# Patient Record
Sex: Female | Born: 1955 | Race: Black or African American | Hispanic: No | Smoking: Current every day smoker
Health system: Southern US, Community
[De-identification: ages and names within clinical notes are randomized; demographics above are authoritative.]

## PROBLEM LIST (undated history)

## (undated) DIAGNOSIS — I10 Essential (primary) hypertension: Secondary | ICD-10-CM

## (undated) DIAGNOSIS — M51369 Other intervertebral disc degeneration, lumbar region without mention of lumbar back pain or lower extremity pain: Secondary | ICD-10-CM

## (undated) DIAGNOSIS — F410 Panic disorder [episodic paroxysmal anxiety] without agoraphobia: Secondary | ICD-10-CM

## (undated) DIAGNOSIS — G629 Polyneuropathy, unspecified: Secondary | ICD-10-CM

## (undated) DIAGNOSIS — M5136 Other intervertebral disc degeneration, lumbar region: Secondary | ICD-10-CM

## (undated) DIAGNOSIS — J449 Chronic obstructive pulmonary disease, unspecified: Secondary | ICD-10-CM

## (undated) HISTORY — PX: BACK SURGERY: SHX140

## (undated) HISTORY — PX: CHOLECYSTECTOMY: SHX55

---

## 2015-09-25 DIAGNOSIS — M542 Cervicalgia: Secondary | ICD-10-CM | POA: Insufficient documentation

## 2016-10-19 ENCOUNTER — Emergency Department (HOSPITAL_COMMUNITY)
Admission: EM | Admit: 2016-10-19 | Discharge: 2016-10-19 | Disposition: A | Discharge status: Home / Self Care | Payer: Medicare Other | Attending: Emergency Medicine | Admitting: Emergency Medicine

## 2016-10-19 ENCOUNTER — Encounter (HOSPITAL_COMMUNITY): Payer: Self-pay | Admitting: Emergency Medicine

## 2016-10-19 ENCOUNTER — Emergency Department (HOSPITAL_COMMUNITY): Payer: Medicare Other

## 2016-10-19 DIAGNOSIS — R1011 Right upper quadrant pain: Secondary | ICD-10-CM

## 2016-10-19 DIAGNOSIS — Z79899 Other long term (current) drug therapy: Secondary | ICD-10-CM | POA: Insufficient documentation

## 2016-10-19 DIAGNOSIS — J449 Chronic obstructive pulmonary disease, unspecified: Secondary | ICD-10-CM | POA: Diagnosis not present

## 2016-10-19 DIAGNOSIS — F17210 Nicotine dependence, cigarettes, uncomplicated: Secondary | ICD-10-CM | POA: Diagnosis not present

## 2016-10-19 DIAGNOSIS — I10 Essential (primary) hypertension: Secondary | ICD-10-CM | POA: Diagnosis not present

## 2016-10-19 DIAGNOSIS — Z760 Encounter for issue of repeat prescription: Secondary | ICD-10-CM | POA: Insufficient documentation

## 2016-10-19 DIAGNOSIS — R310 Gross hematuria: Secondary | ICD-10-CM | POA: Insufficient documentation

## 2016-10-19 HISTORY — DX: Polyneuropathy, unspecified: G62.9

## 2016-10-19 HISTORY — DX: Other intervertebral disc degeneration, lumbar region: M51.36

## 2016-10-19 HISTORY — DX: Panic disorder (episodic paroxysmal anxiety): F41.0

## 2016-10-19 HISTORY — DX: Essential (primary) hypertension: I10

## 2016-10-19 HISTORY — DX: Other intervertebral disc degeneration, lumbar region without mention of lumbar back pain or lower extremity pain: M51.369

## 2016-10-19 HISTORY — DX: Chronic obstructive pulmonary disease, unspecified: J44.9

## 2016-10-19 LAB — COMPREHENSIVE METABOLIC PANEL
ALT: 12 U/L — AB (ref 14–54)
AST: 15 U/L (ref 15–41)
Albumin: 4.3 g/dL (ref 3.5–5.0)
Alkaline Phosphatase: 95 U/L (ref 38–126)
Anion gap: 8 (ref 5–15)
BUN: 10 mg/dL (ref 6–20)
CO2: 34 mmol/L — ABNORMAL HIGH (ref 22–32)
CREATININE: 0.79 mg/dL (ref 0.44–1.00)
Calcium: 9.5 mg/dL (ref 8.9–10.3)
Chloride: 100 mmol/L — ABNORMAL LOW (ref 101–111)
GFR calc non Af Amer: >60 mL/min (ref >60)
Glucose, Bld: 92 mg/dL (ref 65–99)
Potassium: 3.2 mmol/L — ABNORMAL LOW (ref 3.5–5.1)
Sodium: 142 mmol/L (ref 135–145)
TOTAL PROTEIN: 7.8 g/dL (ref 6.5–8.1)
Total Bilirubin: 0.9 mg/dL (ref 0.3–1.2)

## 2016-10-19 LAB — URINALYSIS, ROUTINE W REFLEX MICROSCOPIC
Bacteria, UA: NONE SEEN
Bilirubin Urine: NEGATIVE
GLUCOSE, UA: NEGATIVE mg/dL
KETONES UR: NEGATIVE mg/dL
Nitrite: NEGATIVE
PH: 8 (ref 5.0–8.0)
Protein, ur: 100 mg/dL — AB
Specific Gravity, Urine: 1.021 (ref 1.005–1.030)

## 2016-10-19 LAB — CBC
HCT: 43.2 % (ref 36.0–46.0)
Hemoglobin: 14.6 g/dL (ref 12.0–15.0)
MCH: 28.3 pg (ref 26.0–34.0)
MCHC: 33.8 g/dL (ref 30.0–36.0)
MCV: 83.7 fL (ref 78.0–100.0)
PLATELETS: 197 10*3/uL (ref 150–400)
RBC: 5.16 MIL/uL — AB (ref 3.87–5.11)
RDW: 14.1 % (ref 11.5–15.5)
WBC: 5.1 10*3/uL (ref 4.0–10.5)

## 2016-10-19 LAB — LIPASE, BLOOD: LIPASE: 15 U/L (ref 11–51)

## 2016-10-19 MED ORDER — ACETAMINOPHEN 325 MG PO TABS
650.0000 mg | ORAL_TABLET | Freq: Once | ORAL | Status: AC
  Administered 2016-10-19: 650 mg via ORAL
  Filled 2016-10-19: qty 2

## 2016-10-19 MED ORDER — DULOXETINE HCL 30 MG PO CPEP: 30.0000 mg | ORAL_CAPSULE | Freq: Every day | ORAL | 0 refills | Status: AC

## 2016-10-19 MED ORDER — BACLOFEN 10 MG PO TABS: 10.0000 mg | ORAL_TABLET | Freq: Three times a day (TID) | ORAL | 1 refills | Status: AC

## 2016-10-19 MED ORDER — AMLODIPINE BESYLATE 5 MG PO TABS: 5.0000 mg | ORAL_TABLET | Freq: Every day | ORAL | 0 refills | Status: DC

## 2016-10-19 NOTE — Discharge Instructions (Signed)
You have been evaluated for your abdominal.  No acute changes noted on today's exam.  You have evidence of blood in your urine.  Please follow up with urologist for further management.

## 2016-10-19 NOTE — ED Triage Notes (Addendum)
Patient states that she been having RUQ pain thought was gas but not relieved with any gas x for 2 weeks.  Patient states that is same pains from when she had her gallbladder removed.  Denies n/v. Patient states that she drank baking soda then had diarrhea after.  patient moved here from [place] in Jan and ran out of all her medications on Jan13.  Patient unsure if this could be related or not. Patient tried getting set up with PCP here but cant get in until April

## 2016-10-19 NOTE — ED Provider Notes (Signed)
[facility]-EMERGENCY DEPT Provider Note   CSN: [id number] Arrival date & time: 10/19/16  0810     History   Chief Complaint Chief Complaint  Patient presents with  . RUQ pain    HPI Nancy Marsh is a 61 y.o. female.  HPI   61 year old female with prior surgical history including cholecystectomy presenting today complaining of abdominal pain. Patient states she had her gallbladder removed in 2012. For the past month she has had intermittent pain to the right upper quadrant in the same location and the same sensation when she had her gallbladder removed. Pain is described as a gas-like pressure that comes and goes, usually lasting for several minutes. She tries using Gas-X, baking sodas, and other home remedy with minimal improvement. She denies any associated fever, chills, chest pain, shortness breath, productive cough, dysuria, hematuria, back pain, or rash. She recently moved from [place] to [place] high in January and states that she is out of her medications including her pain medication. States that she was worked up for potential MS and she also has neuropathy and degenerative disc disease and currently taking Percocet. She is actively trying to find a primary care provider but will not have one assigned until April.  Past Medical History:  Diagnosis Date  . COPD (chronic obstructive pulmonary disease) (HCC)   . Degenerative disc disease, lumbar   . Hypertension   . Neuropathy (HCC)   . Panic attack     There are no active problems to display for this patient.   Past Surgical History:  Procedure Laterality Date  . BACK SURGERY    . CHOLECYSTECTOMY      OB History    No data available       Home Medications    Prior to Admission medications   Medication Sig Start Date End Date Taking? Authorizing Provider  albuterol (PROVENTIL HFA;VENTOLIN HFA) 108 (90 Base) MCG/ACT inhaler Inhale 1 puff into the lungs every 6 (six) hours as needed for wheezing or shortness of  breath.   Yes Historical Provider, MD  amLODipine (NORVASC) 5 MG tablet Take 5 mg by mouth daily.   Yes Historical Provider, MD  baclofen (LIORESAL) 10 MG tablet Take 10 mg by mouth 3 (three) times daily.   Yes Historical Provider, MD  DULoxetine (CYMBALTA) 30 MG capsule Take 30 mg by mouth daily.   Yes Historical Provider, MD  Fluticasone-Salmeterol (ADVAIR) 250-50 MCG/DOSE AEPB Inhale 1 puff into the lungs 2 (two) times daily.   Yes Historical Provider, MD  oxyCODONE-acetaminophen (PERCOCET/ROXICET) 5-325 MG tablet Take 1 tablet by mouth every 6 (six) hours as needed for severe pain.   Yes Historical Provider, MD    Family History No family history on file.  Social History Social History  Substance Use Topics  . Smoking status: Current Every Day Smoker    Types: Cigarettes  . Smokeless tobacco: Never Used  . Alcohol use No     Allergies   Ibuprofen   Review of Systems Review of Systems  All other systems reviewed and are negative.    Physical Exam Updated Vital Signs BP 158/98   Pulse 71   Temp 98.7 F (37.1 C) (Oral)   Resp 15   SpO2 100%   Physical Exam  Constitutional: She appears well-developed and well-nourished. No distress.  HENT:  Head: Atraumatic.  Eyes: Conjunctivae are normal.  Neck: Neck supple.  Cardiovascular: Normal rate and regular rhythm.   Pulmonary/Chest: Effort normal and breath sounds normal.  Abdominal: Soft.  She exhibits no distension. There is tenderness (Mild diffuse upper abdominal tenderness without guarding or rebound tenderness. Negative Murphy sign, no pain at McBurney's point.).  Neurological: She is alert.  Skin: No rash noted.  Psychiatric: She has a normal mood and affect.  Nursing note and vitals reviewed.    ED Treatments / Results  Labs (all labs ordered are listed, but only abnormal results are displayed) Labs Reviewed  COMPREHENSIVE METABOLIC PANEL - Abnormal; Notable for the following:       Result Value    Potassium 3.2 (*)    Chloride 100 (*)    CO2 34 (*)    ALT 12 (*)    All other components within normal limits  CBC - Abnormal; Notable for the following:    RBC 5.16 (*)    All other components within normal limits  URINALYSIS, ROUTINE W REFLEX MICROSCOPIC - Abnormal; Notable for the following:    APPearance HAZY (*)    Hgb urine dipstick SMALL (*)    Protein, ur 100 (*)    Leukocytes, UA TRACE (*)    Squamous Epithelial / LPF 0-5 (*)    All other components within normal limits  LIPASE, BLOOD    EKG  EKG Interpretation None       Radiology Ct Renal Stone Study  Result Date: 10/19/2016 CLINICAL DATA:  Right upper quadrant pain not relieved for 2 weeks. Prior gallbladder removal. No nausea or vomiting. EXAM: CT ABDOMEN AND PELVIS WITHOUT CONTRAST TECHNIQUE: Multidetector CT imaging of the abdomen and pelvis was performed following the standard protocol without IV contrast. COMPARISON:  None. FINDINGS: Lower chest: No acute abnormality. Hepatobiliary: Multiple hypodense, fluid attenuating hepatic masses with the largest measuring 10 mm in the right hepatic lobe likely reflecting cysts. Prior cholecystectomy. No intrahepatic or extrahepatic biliary ductal dilatation. Pancreas: Unremarkable. No pancreatic ductal dilatation or surrounding inflammatory changes. Spleen: Normal in size without focal abnormality. Adrenals/Urinary Tract: Adrenal glands are unremarkable. Kidneys are normal, without renal calculi, focal lesion, or hydronephrosis. Bladder is unremarkable. Stomach/Bowel: Stomach is within normal limits. Appendix appears normal. No evidence of bowel wall thickening, distention, or inflammatory changes. No pneumatosis, pneumoperitoneum or portal venous gas. Vascular/Lymphatic: Normal caliber abdominal aorta with atherosclerosis. No lymphadenopathy. Reproductive: No adnexal mass. Ill-defined partially calcified uterine mass likely reflecting a uterine fibroid. Other: No abdominal wall  hernia or abnormality. No abdominopelvic ascites. Musculoskeletal: No acute osseous abnormality. No lytic or sclerotic osseous lesion. Posterior lumbar interbody fusion from L4 through S1. No significant osseous bridging across the L5-S1 disc space as can be seen with pseudoarthrosis. Solid osseous bridging across the L4-5 vertebral body and posterior elements. IMPRESSION: 1. No acute abdominal or pelvic pathology. 2. Prior cholecystectomy. 3.  Aortic Atherosclerosis (ICD10-170.0) Electronically Signed   By: Elige Ko   On: 10/19/2016 11:49    Procedures Procedures (including critical care time)  Medications Ordered in ED Medications - No data to display   Initial Impression / Assessment and Plan / ED Course  I have reviewed the triage vital signs and the nursing notes.  Pertinent labs & imaging results that were available during my care of the patient were reviewed by me and considered in my medical decision making (see chart for details).     BP 158/98   Pulse 71   Temp 98.7 F (37.1 C) (Oral)   Resp 15   SpO2 100%    Final Clinical Impressions(s) / ED Diagnoses   Final diagnoses:  RUQ pain  Medication  refill  Gross hematuria    New Prescriptions New Prescriptions   No medications on file   10:02 AM Patient here with recurrent upper abdominal pain. She has had her gallbladder removed. On exam she has a fairly benign abdomen. Patient also reports that she is out of her home medication due to moving here from [place]. Along the lists of the medication is Percocet. She takes it for chronic back pain. Plan to prescribe blood pressure medication and anti-depression medication however I do not think it is appropriate to prescribe narcotic pain medication for chronic pain.  12:26 PM Patient urine shows RBC too numerous to count but no obvious signs of a urinary tract infection. She has a normal lipase. Her electrolytes panel are reassuring patient has no leukocytosis. An  abdominal and pelvis CT scan reveals no acute pathology. I will refill patient's regular medication and encouraged her to follow-up with PCP for further care. Return precaution discussed. Since patient has blood in the urine without signs of kidney stones or UTI I encouraged patient to follow-up with urology for outpatient care.   Fayrene Helper, PA-C 10/19/16 1235    Derwood Kaplan, MD 10/19/16 [phone]

## 2016-12-08 DIAGNOSIS — J3089 Other allergic rhinitis: Secondary | ICD-10-CM | POA: Insufficient documentation

## 2016-12-08 DIAGNOSIS — M48061 Spinal stenosis, lumbar region without neurogenic claudication: Secondary | ICD-10-CM | POA: Insufficient documentation

## 2016-12-08 DIAGNOSIS — M5136 Other intervertebral disc degeneration, lumbar region: Secondary | ICD-10-CM | POA: Insufficient documentation

## 2016-12-08 DIAGNOSIS — F17219 Nicotine dependence, cigarettes, with unspecified nicotine-induced disorders: Secondary | ICD-10-CM | POA: Insufficient documentation

## 2017-12-21 DIAGNOSIS — E559 Vitamin D deficiency, unspecified: Secondary | ICD-10-CM | POA: Insufficient documentation

## 2018-01-31 IMAGING — CT CT RENAL STONE PROTOCOL
2 of 3 series · 16 of 46 positions shown, 18 images · non-contrast
Comparison: None.

CLINICAL DATA: Right upper quadrant pain not relieved for 2 weeks.
Prior gallbladder removal. No nausea or vomiting.

EXAM:
CT ABDOMEN AND PELVIS WITHOUT CONTRAST
TECHNIQUE: Multidetector CT imaging of the abdomen and pelvis was performed
following the standard protocol without IV contrast.

[Series 4: lung · axial · 0.74mm/px · z∈[-181,-117]mm · 13 of 38 slices shown, 15 images]
[im 3/38  soft-tissue]
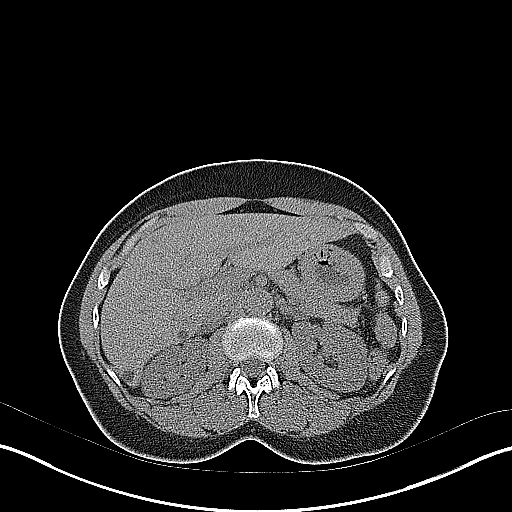
[im 3/38  bone]
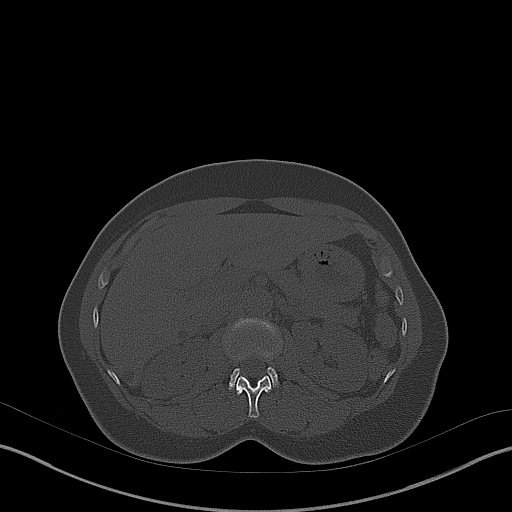
[im 5/38  soft-tissue]
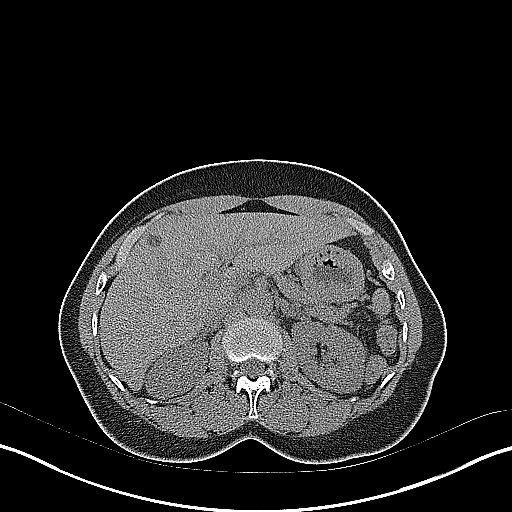
[im 8/38  soft-tissue]
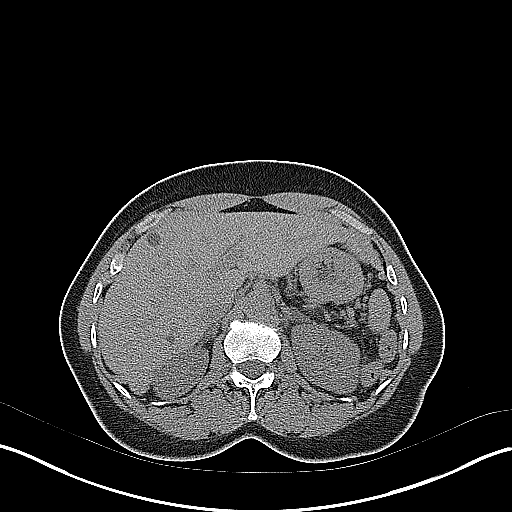
[im 11/38  soft-tissue]
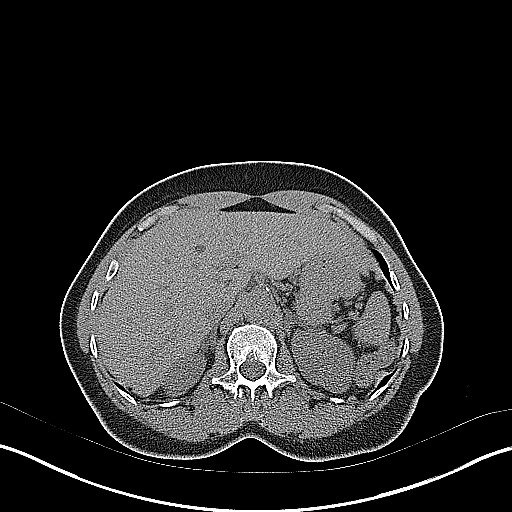
[im 14/38  soft-tissue]
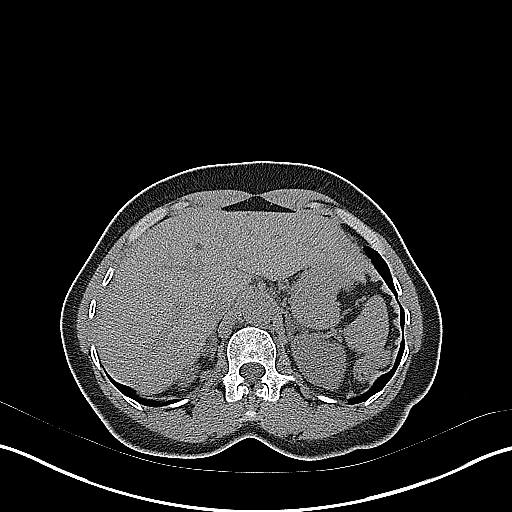
[im 16/38  soft-tissue]
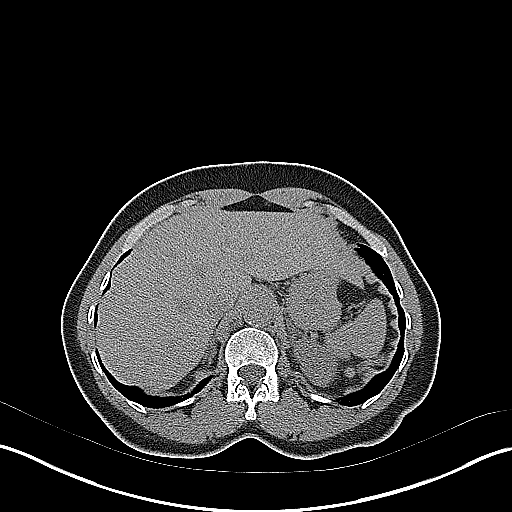
[im 20/38  soft-tissue]
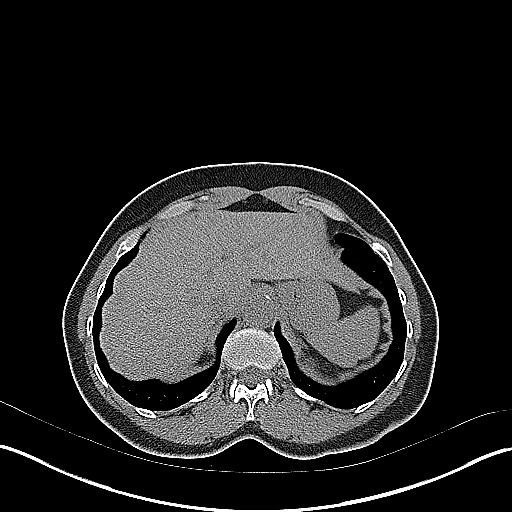
[im 22/38  soft-tissue]
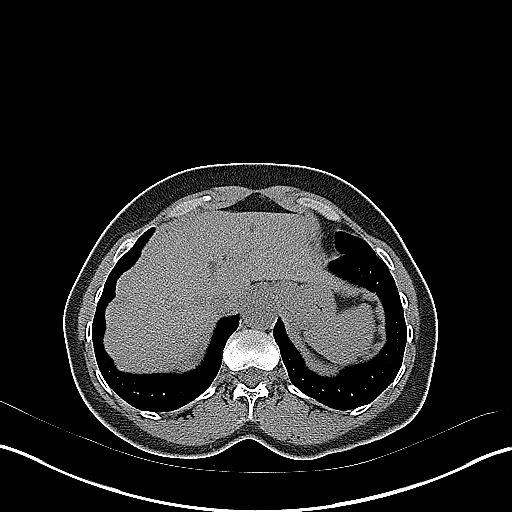
[im 24/38  soft-tissue]
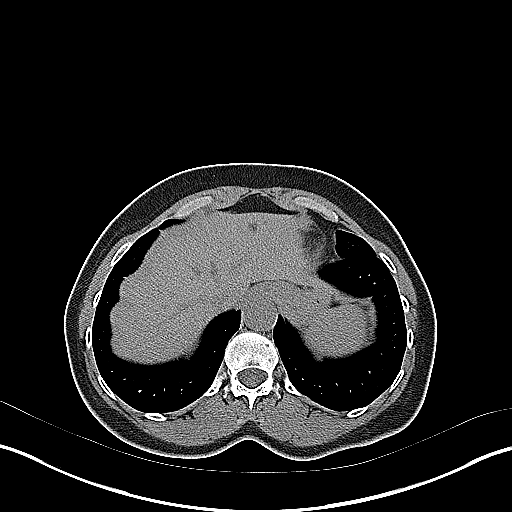
[im 24/38  bone]
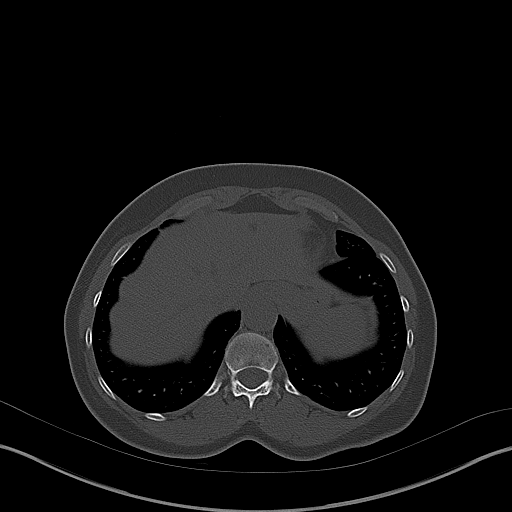
[im 27/38  soft-tissue]
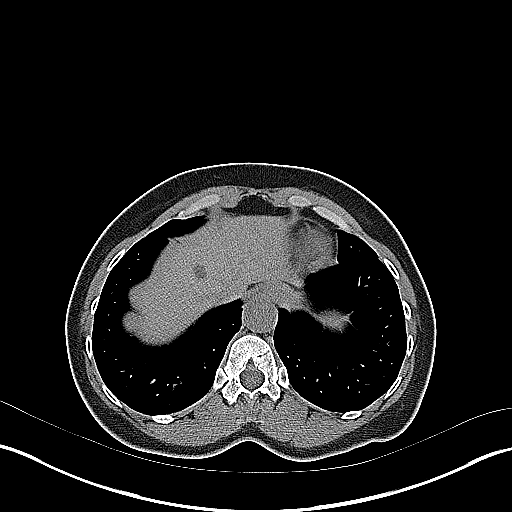
[im 30/38  soft-tissue]
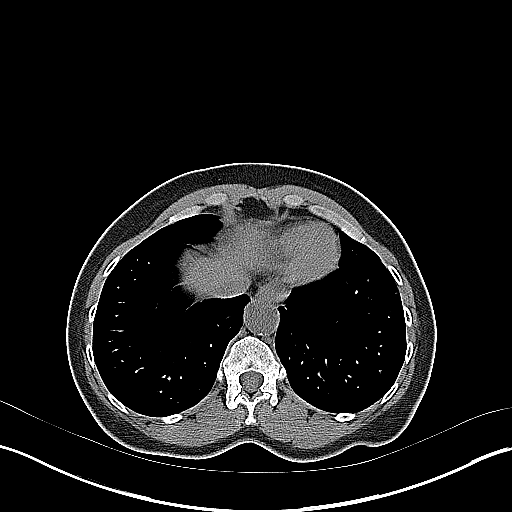
[im 33/38  soft-tissue]
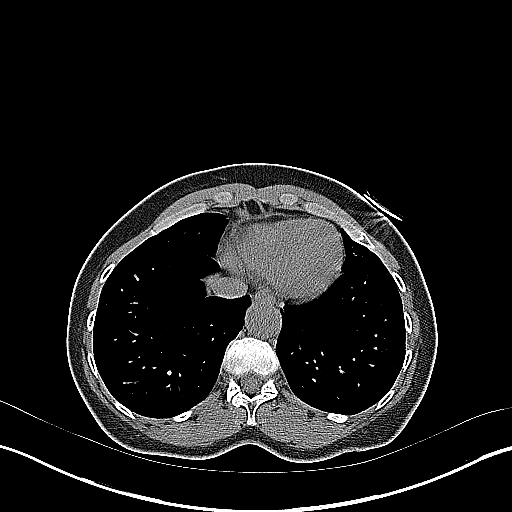
[im 35/38  soft-tissue]
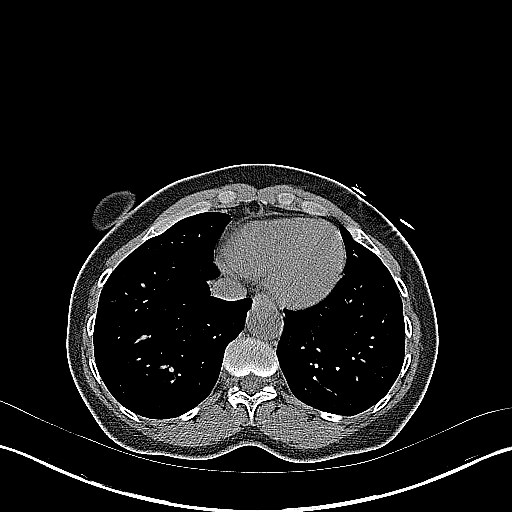

[Series 5: coronal · coronal · 0.73mm/px · 3 of 122 slices shown]
[im 41/122  soft-tissue]
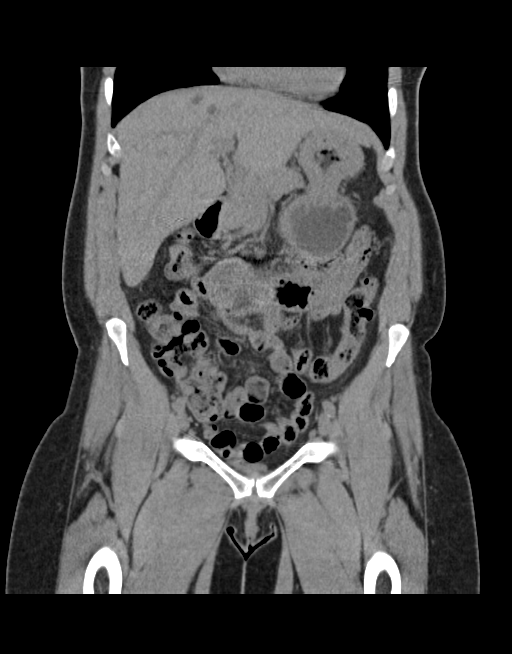
[im 54/122  soft-tissue]
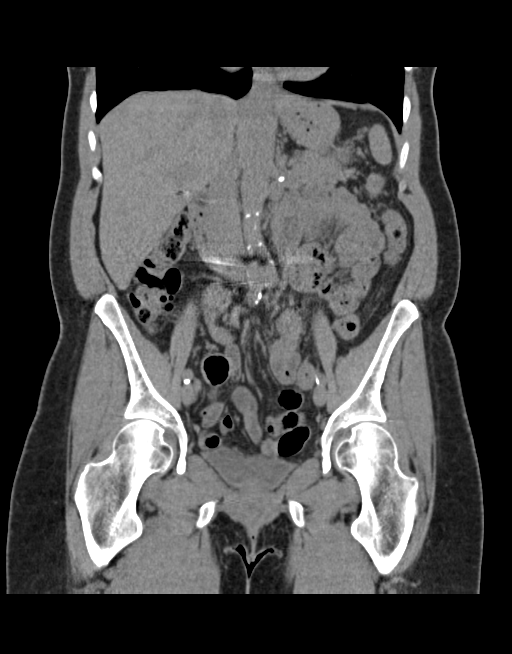
[im 68/122  soft-tissue]
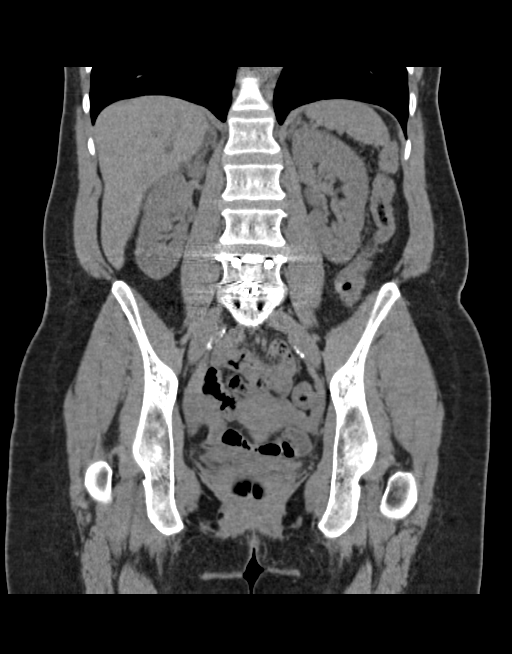

[16 of 46 positions shown; findings below may reference images not displayed]

FINDINGS: Lower chest: No acute abnormality.

Hepatobiliary: Multiple hypodense, fluid attenuating hepatic masses
with the largest measuring 10 mm in the right hepatic lobe likely
reflecting cysts. Prior cholecystectomy. No intrahepatic or
extrahepatic biliary ductal dilatation.

Pancreas: Unremarkable. No pancreatic ductal dilatation or
surrounding inflammatory changes.

Spleen: Normal in size without focal abnormality.

Adrenals/Urinary Tract: Adrenal glands are unremarkable. Kidneys are
normal, without renal calculi, focal lesion, or hydronephrosis.
Bladder is unremarkable.

Stomach/Bowel: Stomach is within normal limits. Appendix appears
normal. No evidence of bowel wall thickening, distention, or
inflammatory changes. No pneumatosis, pneumoperitoneum or portal
venous gas.

Vascular/Lymphatic: Normal caliber abdominal aorta with
atherosclerosis. No lymphadenopathy.

Reproductive: No adnexal mass. Ill-defined partially calcified
uterine mass likely reflecting a uterine fibroid.

Other: No abdominal wall hernia or abnormality. No abdominopelvic
ascites.

Musculoskeletal: No acute osseous abnormality. No lytic or sclerotic
osseous lesion. Posterior lumbar interbody fusion from L4 through
S1. No significant osseous bridging across the L5-S1 disc space as
can be seen with pseudoarthrosis. Solid osseous bridging across the
L4-5 vertebral body and posterior elements.
IMPRESSION: 1. No acute abdominal or pelvic pathology.
2. Prior cholecystectomy.
3.  Aortic Atherosclerosis ([id number])

## 2018-12-28 DIAGNOSIS — F5101 Primary insomnia: Secondary | ICD-10-CM | POA: Insufficient documentation

## 2020-01-02 ENCOUNTER — Encounter: Payer: Self-pay | Admitting: Pulmonary Disease

## 2020-01-02 ENCOUNTER — Other Ambulatory Visit: Payer: Self-pay

## 2020-01-02 ENCOUNTER — Ambulatory Visit: Payer: Medicare Other | Admitting: Pulmonary Disease

## 2020-01-02 VITALS — BP 132/84 | HR 106 | Ht 64.0 in | Wt 123.6 lb

## 2020-01-02 DIAGNOSIS — J449 Chronic obstructive pulmonary disease, unspecified: Secondary | ICD-10-CM

## 2020-01-02 MED ORDER — TRELEGY ELLIPTA 200-62.5-25 MCG/INH IN AEPB: 1.0000 | INHALATION_SPRAY | Freq: Every day | RESPIRATORY_TRACT | 0 refills | Status: DC

## 2020-01-02 MED ORDER — TRELEGY ELLIPTA 200-62.5-25 MCG/INH IN AEPB: 1.0000 | INHALATION_SPRAY | Freq: Every day | RESPIRATORY_TRACT | 2 refills | Status: DC

## 2020-01-02 NOTE — Patient Instructions (Addendum)
We will get some labs today including CBC with differential, IgE, alpha-1 antitrypsin levels and phenotype, hypersensitivity pneumonitis panel. Schedule pulmonary function test and high-res CT of the chest to evaluate hypersensitivity pneumonitis. Stop Advair and start Trelegy inhaler Follow-up in 1 to 2 months.

## 2020-01-02 NOTE — Addendum Note (Signed)
Addended by: Jacquiline Doe on: 01/02/2020 05:11 PM   Modules accepted: Orders

## 2020-01-02 NOTE — Progress Notes (Signed)
Nancy Marsh    [id number]    02-06-1956  Primary Care Physician:Beal, Earma Reading  Referring Physician: Nicholes Rough, PA-C [address] [place],  [place] [address]  Chief complaint:   Consult for COPD  HPI: 64 year old active smoker with hypertension, asthma, COPD Complains of increasing dyspnea for the past 3 months.  Associated with wheezing, no cough, fevers, chills. Currently on Advair for the past 10 years.  She has albuterol which she uses every other day.  Symptoms coincided with her trip to [place] to visit her daughter who had down questions on the couch.  Her daughter has since moved to [place] and [name] visits her daughter often.  Notes increasing dyspnea in her daughter's house.   Pets: Dogs Occupation: Retired Freight forwarder for [employer]: None exposure as noted above.  No hot tubs, Jacuzzis, mold Smoking history: 50-pack-year smoker.  Continues to smoke 1 pack/day Travel history: Previously lived in [place], [place]. Relevant family history: No significant family history of lung disease.   Outpatient Encounter Medications as of 01/02/2020  Medication Sig  . albuterol (PROVENTIL HFA;VENTOLIN HFA) 108 (90 Base) MCG/ACT inhaler Inhale 1 puff into the lungs every 6 (six) hours as needed for wheezing or shortness of breath.  [name] amLODipine (NORVASC) 5 MG tablet Take 1 tablet (5 mg total) by mouth daily.  [name] atorvastatin (LIPITOR) 20 MG tablet Take 20 mg by mouth daily.  . baclofen (LIORESAL) 10 MG tablet Take 1 tablet (10 mg total) by mouth 3 (three) times daily.  . DULoxetine (CYMBALTA) 30 MG capsule Take 1 capsule (30 mg total) by mouth daily.  . Fluticasone-Salmeterol (ADVAIR) 250-50 MCG/DOSE AEPB Inhale 1 puff into the lungs 2 (two) times daily.  [name] oxyCODONE-acetaminophen (PERCOCET/ROXICET) 5-325 MG tablet Take 1 tablet by mouth every 6 (six) hours as needed for severe pain.  . promethazine-codeine (PHENERGAN WITH CODEINE) 6.25-10  MG/5ML syrup Take 5 mLs by mouth every 6 (six) hours as needed for cough.  . traZODone (DESYREL) 50 MG tablet Take 50 mg by mouth at bedtime.  . Vitamin D, Ergocalciferol, (DRISDOL) 1.25 MG (50000 UNIT) CAPS capsule Take 50,000 Units by mouth every 7 (seven) days.   No facility-administered encounter medications on file as of 01/02/2020.    Allergies as of 01/02/2020 - Review Complete 10/19/2016  Allergen Reaction Noted  . Ibuprofen Anaphylaxis 10/19/2016  . Nsaids  01/02/2020    Past Medical History:  Diagnosis Date  . COPD (chronic obstructive pulmonary disease) ([facility])   . Degenerative disc disease, lumbar   . Hypertension   . Neuropathy   . Panic attack     Past Surgical History:  Procedure Laterality Date  . BACK SURGERY    . CHOLECYSTECTOMY      No family history on file.  Social History   Socioeconomic History  . Marital status: Legally Separated    Spouse name: Not on file  . Number of children: Not on file  . Years of education: Not on file  . Highest education level: Not on file  Occupational History  . Not on file  Tobacco Use  . Smoking status: Current Every Day Smoker    Packs/day: 1.00    Types: Cigarettes  . Smokeless tobacco: Never Used  Substance and Sexual Activity  . Alcohol use: No  . Drug use: Not on file  . Sexual activity: Not on file  Other Topics Concern  . Not on file  Social History  Narrative  . Not on file   Social Determinants of Health   Financial Resource Strain:   . Difficulty of Paying Living Expenses:   Food Insecurity:   . Worried About Programme researcher, broadcasting/film/video in the Last Year:   . Barista in the Last Year:   Transportation Needs:   . Freight forwarder (Medical):   [name] Lack of Transportation (Non-Medical):   Physical Activity:   . Days of Exercise per Week:   . Minutes of Exercise per Session:   Stress:   . Feeling of Stress :   Social Connections:   . Frequency of Communication with Friends and Family:   .  Frequency of Social Gatherings with Friends and Family:   . Attends Religious Services:   . Active Member of Clubs or Organizations:   . Attends Banker Meetings:   [name] Marital Status:   Intimate Partner Violence:   . Fear of Current or Ex-Partner:   . Emotionally Abused:   [name] Physically Abused:   . Sexually Abused:     Review of systems: Review of Systems  Constitutional: Negative for fever and chills.  HENT: Negative.   Eyes: Negative for blurred vision.  Respiratory: as per HPI  Cardiovascular: Negative for chest pain and palpitations.  Gastrointestinal: Negative for vomiting, diarrhea, blood per rectum. Genitourinary: Negative for dysuria, urgency, frequency and hematuria.  Musculoskeletal: Negative for myalgias, back pain and joint pain.  Skin: Negative for itching and rash.  Neurological: Negative for dizziness, tremors, focal weakness, seizures and loss of consciousness.  Endo/Heme/Allergies: Negative for environmental allergies.  Psychiatric/Behavioral: Negative for depression, suicidal ideas and hallucinations.  All other systems reviewed and are negative.  Physical Exam: Blood pressure 132/84, pulse (!) 106, height 5\' 4"  (1.626 m), weight 123 lb 9.6 oz (56.1 kg), SpO2 94 %. Gen:      No acute distress HEENT:  EOMI, sclera anicteric Neck:     No masses; no thyromegaly Lungs:    Clear to auscultation bilaterally; normal respiratory effort CV:         Regular rate and rhythm; no murmurs Abd:      + bowel sounds; soft, non-tender; no palpable masses, no distension Ext:    No edema; adequate peripheral perfusion Skin:      Warm and dry; no rash Neuro: alert and oriented x 3 Psych: normal mood and affect  Data Reviewed: Imaging:   PFTs:  Labs:  Assessment:  Assessment for dyspnea Likely has COPD given extensive smoking history. Onset of acute symptoms also coincides with exposure to down feathers so will need evaluation for hypersensitivity  pneumonitis  We will schedule high-res CT, PFTs. Check CBC differential, IgE, alpha-1 antitrypsin and hypersensitivity panel Stop the Advair and start her on Trelegy inhaler  Plan/Recommendations: - PFTs, high-resolution CT - CBC, IgE, alpha-1 antitrypsin levels and phenotype, hypersensitivity panel - Trelegy inhaler  MD [facility] 01/02/2020, 2:50 PM  CC: 01/04/2020, PA-C

## 2020-01-03 ENCOUNTER — Other Ambulatory Visit (INDEPENDENT_AMBULATORY_CARE_PROVIDER_SITE_OTHER): Payer: Medicare Other

## 2020-01-03 DIAGNOSIS — J449 Chronic obstructive pulmonary disease, unspecified: Secondary | ICD-10-CM

## 2020-01-03 LAB — CBC WITH DIFFERENTIAL/PLATELET
Basophils Absolute: 0 10*3/uL (ref 0.0–0.1)
Basophils Relative: 0.6 % (ref 0.0–3.0)
Eosinophils Absolute: 0.1 10*3/uL (ref 0.0–0.7)
Eosinophils Relative: 1.6 % (ref 0.0–5.0)
HCT: 44.2 % (ref 36.0–46.0)
Hemoglobin: 14.5 g/dL (ref 12.0–15.0)
Lymphocytes Relative: 53.5 % — ABNORMAL HIGH (ref 12.0–46.0)
Lymphs Abs: 2.8 10*3/uL (ref 0.7–4.0)
MCHC: 32.9 g/dL (ref 30.0–36.0)
MCV: 86.3 fl (ref 78.0–100.0)
Monocytes Absolute: 0.4 10*3/uL (ref 0.1–1.0)
Monocytes Relative: 7 % (ref 3.0–12.0)
Neutro Abs: 1.9 10*3/uL (ref 1.4–7.7)
Neutrophils Relative %: 37.3 % — ABNORMAL LOW (ref 43.0–77.0)
Platelets: 189 10*3/uL (ref 150.0–400.0)
RBC: 5.12 Mil/uL — ABNORMAL HIGH (ref 3.87–5.11)
RDW: 14.3 % (ref 11.5–15.5)
WBC: 5.2 10*3/uL (ref 4.0–10.5)

## 2020-01-08 LAB — HYPERSENSITIVITY PNEUMONITIS
A. Pullulans Abs: NEGATIVE
A.Fumigatus #1 Abs: NEGATIVE
Micropolyspora faeni, IgG: NEGATIVE
Pigeon Serum Abs: NEGATIVE
Thermoact. Saccharii: NEGATIVE
Thermoactinomyces vulgaris, IgG: NEGATIVE

## 2020-01-12 LAB — IGE: IgE (Immunoglobulin E), Serum: 463 kU/L — ABNORMAL HIGH (ref <=114)

## 2020-01-12 LAB — ALPHA-1 ANTITRYPSIN PHENOTYPE: A-1 Antitrypsin, Ser: 153 mg/dL (ref 83–199)

## 2020-02-01 ENCOUNTER — Ambulatory Visit (INDEPENDENT_AMBULATORY_CARE_PROVIDER_SITE_OTHER)
Admission: RE | Admit: 2020-02-01 | Discharge: 2020-02-01 | Disposition: A | Discharge status: Home / Self Care | Payer: Medicare Other | Source: Ambulatory Visit | Attending: Pulmonary Disease | Admitting: Pulmonary Disease

## 2020-02-01 ENCOUNTER — Other Ambulatory Visit: Payer: Self-pay

## 2020-02-01 DIAGNOSIS — J449 Chronic obstructive pulmonary disease, unspecified: Secondary | ICD-10-CM

## 2020-02-03 ENCOUNTER — Other Ambulatory Visit (HOSPITAL_COMMUNITY)
Admission: RE | Admit: 2020-02-03 | Discharge: 2020-02-03 | Disposition: A | Discharge status: Home / Self Care | Payer: Medicare Other | Source: Ambulatory Visit | Attending: Pulmonary Disease | Admitting: Pulmonary Disease

## 2020-02-03 DIAGNOSIS — Z20822 Contact with and (suspected) exposure to covid-19: Secondary | ICD-10-CM | POA: Diagnosis not present

## 2020-02-03 DIAGNOSIS — Z01812 Encounter for preprocedural laboratory examination: Secondary | ICD-10-CM | POA: Diagnosis present

## 2020-02-04 LAB — SARS CORONAVIRUS 2 (TAT 6-24 HRS): SARS Coronavirus 2: NEGATIVE

## 2020-02-07 ENCOUNTER — Other Ambulatory Visit: Payer: Self-pay | Admitting: *Deleted

## 2020-02-07 DIAGNOSIS — J449 Chronic obstructive pulmonary disease, unspecified: Secondary | ICD-10-CM

## 2020-03-18 ENCOUNTER — Other Ambulatory Visit (HOSPITAL_COMMUNITY)
Admission: RE | Admit: 2020-03-18 | Discharge: 2020-03-18 | Disposition: A | Discharge status: Home / Self Care | Payer: Medicare Other | Source: Ambulatory Visit | Attending: Pulmonary Disease | Admitting: Pulmonary Disease

## 2020-03-18 DIAGNOSIS — Z01812 Encounter for preprocedural laboratory examination: Secondary | ICD-10-CM | POA: Insufficient documentation

## 2020-03-18 DIAGNOSIS — Z20822 Contact with and (suspected) exposure to covid-19: Secondary | ICD-10-CM | POA: Insufficient documentation

## 2020-03-18 LAB — SARS CORONAVIRUS 2 (TAT 6-24 HRS): SARS Coronavirus 2: NEGATIVE

## 2020-03-21 ENCOUNTER — Ambulatory Visit (INDEPENDENT_AMBULATORY_CARE_PROVIDER_SITE_OTHER): Payer: Medicare Other | Admitting: Pulmonary Disease

## 2020-03-21 ENCOUNTER — Other Ambulatory Visit: Payer: Self-pay

## 2020-03-21 DIAGNOSIS — J449 Chronic obstructive pulmonary disease, unspecified: Secondary | ICD-10-CM | POA: Diagnosis not present

## 2020-03-21 LAB — PULMONARY FUNCTION TEST
DL/VA % pred: 67 %
DL/VA: 2.82 ml/min/mmHg/L
DLCO cor % pred: 47 %
DLCO cor: 9.53 ml/min/mmHg
DLCO unc % pred: 47 %
DLCO unc: 9.53 ml/min/mmHg
FEF 25-75 Post: 0.56 L/sec
FEF 25-75 Pre: 0.34 L/sec
FEF2575-%Change-Post: 65 %
FEF2575-%Pred-Post: 27 %
FEF2575-%Pred-Pre: 16 %
FEV1-%Change-Post: 22 %
FEV1-%Pred-Post: 50 %
FEV1-%Pred-Pre: 40 %
FEV1-Post: 1.02 L
FEV1-Pre: 0.83 L
FEV1FVC-%Change-Post: 9 %
FEV1FVC-%Pred-Pre: 64 %
FEV6-%Change-Post: 11 %
FEV6-%Pred-Post: 71 %
FEV6-%Pred-Pre: 64 %
FEV6-Post: 1.81 L
FEV6-Pre: 1.62 L
FEV6FVC-%Change-Post: 0 %
FEV6FVC-%Pred-Post: 101 %
FEV6FVC-%Pred-Pre: 102 %
FVC-%Change-Post: 12 %
FVC-%Pred-Post: 70 %
FVC-%Pred-Pre: 62 %
FVC-Post: 1.84 L
FVC-Pre: 1.64 L
Post FEV1/FVC ratio: 56 %
Post FEV6/FVC ratio: 98 %
Pre FEV1/FVC ratio: 51 %
Pre FEV6/FVC Ratio: 99 %
RV % pred: 166 %
RV: 3.37 L
TLC % pred: 103 %
TLC: 5.22 L

## 2020-03-21 NOTE — Progress Notes (Signed)
Full PFT performed today. °

## 2020-08-15 ENCOUNTER — Other Ambulatory Visit: Payer: Self-pay | Admitting: Pulmonary Disease

## 2020-09-14 ENCOUNTER — Other Ambulatory Visit: Payer: Medicare Other

## 2020-11-28 ENCOUNTER — Other Ambulatory Visit: Payer: Self-pay | Admitting: Pulmonary Disease

## 2021-02-13 ENCOUNTER — Other Ambulatory Visit: Payer: Self-pay | Admitting: Pulmonary Disease

## 2021-02-14 ENCOUNTER — Other Ambulatory Visit: Payer: Self-pay | Admitting: Pulmonary Disease

## 2021-12-19 ENCOUNTER — Encounter (HOSPITAL_COMMUNITY): Payer: Self-pay

## 2021-12-19 ENCOUNTER — Emergency Department (HOSPITAL_COMMUNITY)
Admission: EM | Admit: 2021-12-19 | Discharge: 2021-12-19 | Disposition: A | Discharge status: Home / Self Care | Payer: Medicare Other | Attending: Emergency Medicine | Admitting: Emergency Medicine

## 2021-12-19 ENCOUNTER — Other Ambulatory Visit: Payer: Self-pay

## 2021-12-19 ENCOUNTER — Emergency Department (HOSPITAL_COMMUNITY): Payer: Medicare Other

## 2021-12-19 DIAGNOSIS — R059 Cough, unspecified: Secondary | ICD-10-CM | POA: Insufficient documentation

## 2021-12-19 DIAGNOSIS — Z20822 Contact with and (suspected) exposure to covid-19: Secondary | ICD-10-CM | POA: Diagnosis not present

## 2021-12-19 DIAGNOSIS — I10 Essential (primary) hypertension: Secondary | ICD-10-CM | POA: Insufficient documentation

## 2021-12-19 DIAGNOSIS — Z7951 Long term (current) use of inhaled steroids: Secondary | ICD-10-CM | POA: Diagnosis not present

## 2021-12-19 DIAGNOSIS — R0602 Shortness of breath: Secondary | ICD-10-CM | POA: Diagnosis not present

## 2021-12-19 DIAGNOSIS — J45909 Unspecified asthma, uncomplicated: Secondary | ICD-10-CM | POA: Insufficient documentation

## 2021-12-19 DIAGNOSIS — J441 Chronic obstructive pulmonary disease with (acute) exacerbation: Secondary | ICD-10-CM

## 2021-12-19 DIAGNOSIS — J449 Chronic obstructive pulmonary disease, unspecified: Secondary | ICD-10-CM | POA: Insufficient documentation

## 2021-12-19 DIAGNOSIS — Z79899 Other long term (current) drug therapy: Secondary | ICD-10-CM | POA: Insufficient documentation

## 2021-12-19 LAB — RESP PANEL BY RT-PCR (FLU A&B, COVID) ARPGX2
Influenza A by PCR: NEGATIVE
Influenza B by PCR: NEGATIVE
SARS Coronavirus 2 by RT PCR: NEGATIVE

## 2021-12-19 MED ORDER — FLUTICASONE-UMECLIDIN-VILANT 200-62.5-25 MCG/ACT IN AEPB: 1.0000 | INHALATION_SPRAY | Freq: Every day | RESPIRATORY_TRACT | 0 refills | Status: AC

## 2021-12-19 MED ORDER — ALBUTEROL SULFATE HFA 108 (90 BASE) MCG/ACT IN AERS: 1.0000 | INHALATION_SPRAY | Freq: Four times a day (QID) | RESPIRATORY_TRACT | 1 refills | Status: AC | PRN

## 2021-12-19 MED ORDER — IPRATROPIUM-ALBUTEROL 0.5-2.5 (3) MG/3ML IN SOLN
3.0000 mL | Freq: Once | RESPIRATORY_TRACT | Status: AC
  Administered 2021-12-19: 3 mL via RESPIRATORY_TRACT
  Filled 2021-12-19: qty 3

## 2021-12-19 MED ORDER — PREDNISONE 10 MG PO TABS: 20.0000 mg | ORAL_TABLET | Freq: Every day | ORAL | 0 refills | Status: DC

## 2021-12-19 NOTE — Discharge Instructions (Signed)
You have been seen and discharged from the emergency department.  Your flu and COVID swab was negative, no signs of pneumonia on the chest x-ray.  You are most likely suffering from a COPD exacerbation.  Refill your inhalers and use as prescribed.  Take steroids as directed.  Follow-up with your primary provider for further evaluation and further care. Take home medications as prescribed. If you have any worsening symptoms or further concerns for your health please return to an emergency department for further evaluation. ?

## 2021-12-19 NOTE — ED Provider Notes (Signed)
?[facility] DEPT ?Provider Note ? ? ?CSN: [id number] ?Arrival date & time: 12/19/21  [id number] ? ?  ? ?History ? ?Chief Complaint  ?Patient presents with  ? Shortness of Breath  ?  Pt arrived via EMS from home c/o sob since yesterday worsening this morning. Tripoding and wheezing on EMS arrival. Given solumedrol and albuterol by FD. Pt has hx of asthma and copd.   ? ? ?Nancy Marsh is a 66 y.o. female. ? ?HPI ? ?66 year old female with past medical history of COPD, HTN presents to the emergency department with concern for intermittently productive cough and wheezing.  Patient states she began to feel unwell starting yesterday morning.  She had a difficult time sleeping and this morning was having worsening shortness of breath, wheezing.  She attempted to use her expired albuterol inhaler at home without any relief.  Denies any fever, hemoptysis or recent sick contacts.  No swelling of her lower extremities.  No chest pain. ? ?Home Medications ?Prior to Admission medications   ?Medication Sig Start Date End Date Taking? Authorizing Provider  ?albuterol (PROVENTIL HFA;VENTOLIN HFA) 108 (90 Base) MCG/ACT inhaler Inhale 1 puff into the lungs every 6 (six) hours as needed for wheezing or shortness of breath.    [provider]  ?amLODipine (NORVASC) 5 MG tablet Take 1 tablet (5 mg total) by mouth daily. 10/19/16   Domenic Moras, PA-C  ?atorvastatin (LIPITOR) 20 MG tablet Take 20 mg by mouth daily.    [provider]  ?baclofen (LIORESAL) 10 MG tablet Take 1 tablet (10 mg total) by mouth 3 (three) times daily. 10/19/16   Domenic Moras, PA-C  ?DULoxetine (CYMBALTA) 30 MG capsule Take 1 capsule (30 mg total) by mouth daily. 10/19/16   Domenic Moras, PA-C  ?Fluticasone-Umeclidin-Vilant (TRELEGY ELLIPTA) 200-62.5-25 MCG/INH AEPB Inhale 1 puff into the lungs daily. 01/02/20   Marshell Garfinkel, MD  ?oxyCODONE-acetaminophen (PERCOCET/ROXICET) 5-325 MG tablet Take 1 tablet by mouth every 6 (six)  hours as needed for severe pain.    [provider]  ?promethazine-codeine (PHENERGAN WITH CODEINE) 6.25-10 MG/5ML syrup Take 5 mLs by mouth every 6 (six) hours as needed for cough.    [provider]  ?traZODone (DESYREL) 50 MG tablet Take 50 mg by mouth at bedtime.    [provider]  ?Donnal Debar 200-62.5-25 MCG/INH AEPB Inhale 1 puff into the lungs daily. 11/28/20   Mannam, Hart Robinsons, MD  ?Vitamin D, Ergocalciferol, (DRISDOL) 1.25 MG (50000 UNIT) CAPS capsule Take 50,000 Units by mouth every 7 (seven) days.    [provider]  ?   ? ?Allergies    ?Ibuprofen and Nsaids   ? ?Review of Systems   ?Review of Systems  ?Constitutional:  Positive for fatigue. Negative for fever.  ?Respiratory:  Positive for cough, shortness of breath and wheezing.   ?Cardiovascular:  Negative for chest pain, palpitations and leg swelling.  ?Gastrointestinal:  Negative for abdominal pain, diarrhea and vomiting.  ?Skin:  Negative for rash.  ?Neurological:  Negative for headaches.  ? ?Physical Exam ?Updated Vital Signs ?BP 139/85   Pulse 77   Temp 97.9 ?F (36.6 ?C) (Oral)   Resp 18   Ht 5\' 4"  (1.626 m)   Wt 57.2 kg   SpO2 92%   BMI 21.63 kg/m?  ?Physical Exam ?Vitals and nursing note reviewed.  ?Constitutional:   ?   General: She is not in acute distress. ?   Appearance: Normal appearance.  ?HENT:  ?   Head:  Normocephalic.  ?   Mouth/Throat:  ?   Mouth: Mucous membranes are moist.  ?Cardiovascular:  ?   Rate and Rhythm: Normal rate.  ?Pulmonary:  ?   Effort: Pulmonary effort is normal. No tachypnea, accessory muscle usage or respiratory distress.  ?   Breath sounds: Wheezing present.  ?Abdominal:  ?   Palpations: Abdomen is soft.  ?   Tenderness: There is no abdominal tenderness.  ?Musculoskeletal:  ?   Right lower leg: No edema.  ?   Left lower leg: No edema.  ?Skin: ?   General: Skin is warm.  ?Neurological:  ?   Mental Status: She is alert and oriented to person, place, and time. Mental status  is at baseline.  ?Psychiatric:     ?   Mood and Affect: Mood normal.  ? ? ?ED Results / Procedures / Treatments   ?Labs ?(all labs ordered are listed, but only abnormal results are displayed) ?Labs Reviewed  ?RESP PANEL BY RT-PCR (FLU A&B, COVID) [id number]  ? ? ?EKG ?None ? ?Radiology ?DG Chest 2 View ? ?Result Date: 12/19/2021 ?CLINICAL DATA:  cough EXAM: CHEST - 2 VIEW COMPARISON:  None. FINDINGS: No consolidation. No visible pleural effusions or pneumothorax. Cardiomediastinal silhouette is mildly enlarged. No evidence of acute osseous abnormality. IMPRESSION: 1. No evidence of acute cardiopulmonary disease. 2. Mild cardiomegaly. Electronically Signed   By: Margaretha Sheffield M.D.   On: 12/19/2021 09:56   ? ?Procedures ?Procedures  ? ? ?Medications Ordered in ED ?Medications  ?ipratropium-albuterol (DUONEB) 0.5-2.5 (3) MG/3ML nebulizer solution 3 mL (3 mLs Nebulization Given 12/19/21 0952)  ? ? ?ED Course/ Medical Decision Making/ A&P ?  ?                        ?Medical Decision Making ?Amount and/or Complexity of Data Reviewed ?Radiology: ordered. ? ?Risk ?Prescription drug management. ? ? ?66 year old female presents emergency department shortness of breath and wheezing, started last night, worse this morning.  Was given Solu-Medrol and albuterol in route with EMS with significant improvement.  On arrival she is on room air, no hypoxia.  Decreased breath sounds at the bases with faint wheezing. ? ?Flu and COVID swab is negative, chest x-ray is unremarkable.  After another breathing treatment here wheezing is resolved.  She feels well and back to baseline.  Patient was likely suffering from viral URI/bronchitis/COPD exacerbation.  Plan to refill inhalers and treat with steroids and outpatient follow-up.  Patient at this time appears safe and stable for discharge and close outpatient follow up. Discharge plan and strict return to ED precautions discussed, patient verbalizes understanding and  agreement. ? ? ? ? ? ? ? ?Final Clinical Impression(s) / ED Diagnoses ?Final diagnoses:  ?None  ? ? ?Rx / DC Orders ?ED Discharge Orders   ? ? None  ? ?  ? ? ?  ?Lorelle Gibbs, DO ?12/19/21 1219 ? ?

## 2021-12-30 DIAGNOSIS — F1120 Opioid dependence, uncomplicated: Secondary | ICD-10-CM | POA: Insufficient documentation

## 2021-12-30 DIAGNOSIS — M961 Postlaminectomy syndrome, not elsewhere classified: Secondary | ICD-10-CM | POA: Insufficient documentation

## 2021-12-30 DIAGNOSIS — M47816 Spondylosis without myelopathy or radiculopathy, lumbar region: Secondary | ICD-10-CM | POA: Insufficient documentation

## 2022-01-01 ENCOUNTER — Inpatient Hospital Stay (HOSPITAL_COMMUNITY)
Admission: EM | Admit: 2022-01-01 | Discharge: 2022-01-04 | DRG: 286 | Disposition: A | Discharge status: Home / Self Care | Payer: Medicare Other | Attending: Family Medicine | Admitting: Family Medicine

## 2022-01-01 ENCOUNTER — Emergency Department (HOSPITAL_COMMUNITY): Payer: Medicare Other

## 2022-01-01 DIAGNOSIS — M5136 Other intervertebral disc degeneration, lumbar region: Secondary | ICD-10-CM | POA: Diagnosis present

## 2022-01-01 DIAGNOSIS — I447 Left bundle-branch block, unspecified: Secondary | ICD-10-CM | POA: Diagnosis not present

## 2022-01-01 DIAGNOSIS — F410 Panic disorder [episodic paroxysmal anxiety] without agoraphobia: Secondary | ICD-10-CM | POA: Diagnosis present

## 2022-01-01 DIAGNOSIS — I472 Ventricular tachycardia, unspecified: Secondary | ICD-10-CM | POA: Diagnosis not present

## 2022-01-01 DIAGNOSIS — I509 Heart failure, unspecified: Secondary | ICD-10-CM | POA: Diagnosis present

## 2022-01-01 DIAGNOSIS — J439 Emphysema, unspecified: Secondary | ICD-10-CM | POA: Diagnosis present

## 2022-01-01 DIAGNOSIS — E785 Hyperlipidemia, unspecified: Secondary | ICD-10-CM | POA: Diagnosis present

## 2022-01-01 DIAGNOSIS — Z888 Allergy status to other drugs, medicaments and biological substances status: Secondary | ICD-10-CM | POA: Diagnosis not present

## 2022-01-01 DIAGNOSIS — I272 Pulmonary hypertension, unspecified: Secondary | ICD-10-CM | POA: Diagnosis present

## 2022-01-01 DIAGNOSIS — J441 Chronic obstructive pulmonary disease with (acute) exacerbation: Secondary | ICD-10-CM | POA: Diagnosis present

## 2022-01-01 DIAGNOSIS — I5043 Acute on chronic combined systolic (congestive) and diastolic (congestive) heart failure: Principal | ICD-10-CM

## 2022-01-01 DIAGNOSIS — I5A Non-ischemic myocardial injury (non-traumatic): Secondary | ICD-10-CM | POA: Diagnosis present

## 2022-01-01 DIAGNOSIS — F17210 Nicotine dependence, cigarettes, uncomplicated: Secondary | ICD-10-CM | POA: Diagnosis present

## 2022-01-01 DIAGNOSIS — R0603 Acute respiratory distress: Secondary | ICD-10-CM | POA: Diagnosis not present

## 2022-01-01 DIAGNOSIS — I10 Essential (primary) hypertension: Secondary | ICD-10-CM | POA: Diagnosis not present

## 2022-01-01 DIAGNOSIS — I428 Other cardiomyopathies: Secondary | ICD-10-CM | POA: Diagnosis present

## 2022-01-01 DIAGNOSIS — R7401 Elevation of levels of liver transaminase levels: Secondary | ICD-10-CM | POA: Diagnosis not present

## 2022-01-01 DIAGNOSIS — I110 Hypertensive heart disease with heart failure: Principal | ICD-10-CM | POA: Diagnosis present

## 2022-01-01 DIAGNOSIS — Z7951 Long term (current) use of inhaled steroids: Secondary | ICD-10-CM

## 2022-01-01 DIAGNOSIS — G629 Polyneuropathy, unspecified: Secondary | ICD-10-CM | POA: Diagnosis present

## 2022-01-01 DIAGNOSIS — I5021 Acute systolic (congestive) heart failure: Secondary | ICD-10-CM | POA: Diagnosis present

## 2022-01-01 DIAGNOSIS — E440 Moderate protein-calorie malnutrition: Secondary | ICD-10-CM | POA: Insufficient documentation

## 2022-01-01 DIAGNOSIS — Z981 Arthrodesis status: Secondary | ICD-10-CM

## 2022-01-01 DIAGNOSIS — Z79899 Other long term (current) drug therapy: Secondary | ICD-10-CM

## 2022-01-01 DIAGNOSIS — G894 Chronic pain syndrome: Secondary | ICD-10-CM | POA: Diagnosis present

## 2022-01-01 DIAGNOSIS — Z635 Disruption of family by separation and divorce: Secondary | ICD-10-CM | POA: Diagnosis not present

## 2022-01-01 DIAGNOSIS — I2510 Atherosclerotic heart disease of native coronary artery without angina pectoris: Secondary | ICD-10-CM | POA: Diagnosis present

## 2022-01-01 DIAGNOSIS — Z87892 Personal history of anaphylaxis: Secondary | ICD-10-CM | POA: Diagnosis not present

## 2022-01-01 LAB — TROPONIN I (HIGH SENSITIVITY)
Troponin I (High Sensitivity): 57 ng/L — ABNORMAL HIGH (ref <18)
Troponin I (High Sensitivity): 102 ng/L (ref <18)

## 2022-01-01 LAB — COMPREHENSIVE METABOLIC PANEL
ALT: 138 U/L — ABNORMAL HIGH (ref 0–44)
AST: 105 U/L — ABNORMAL HIGH (ref 15–41)
Albumin: 3.4 g/dL — ABNORMAL LOW (ref 3.5–5.0)
Alkaline Phosphatase: 95 U/L (ref 38–126)
Anion gap: 8 (ref 5–15)
BUN: 14 mg/dL (ref 8–23)
CO2: 23 mmol/L (ref 22–32)
Calcium: 8.8 mg/dL — ABNORMAL LOW (ref 8.9–10.3)
Chloride: 109 mmol/L (ref 98–111)
Creatinine, Ser: 0.9 mg/dL (ref 0.44–1.00)
GFR, Estimated: >60 mL/min (ref >60)
Glucose, Bld: 134 mg/dL — ABNORMAL HIGH (ref 70–99)
Potassium: 3.5 mmol/L (ref 3.5–5.1)
Sodium: 140 mmol/L (ref 135–145)
Total Bilirubin: 0.4 mg/dL (ref 0.3–1.2)
Total Protein: 6.3 g/dL — ABNORMAL LOW (ref 6.5–8.1)

## 2022-01-01 LAB — I-STAT VENOUS BLOOD GAS, ED
Acid-Base Excess: 2 mmol/L (ref 0.0–2.0)
Bicarbonate: 28.4 mmol/L — ABNORMAL HIGH (ref 20.0–28.0)
Calcium, Ion: 1.01 mmol/L — ABNORMAL LOW (ref 1.15–1.40)
HCT: 45 % (ref 36.0–46.0)
Hemoglobin: 15.3 g/dL — ABNORMAL HIGH (ref 12.0–15.0)
O2 Saturation: 99 %
Potassium: 3.8 mmol/L (ref 3.5–5.1)
Sodium: 137 mmol/L (ref 135–145)
TCO2: 30 mmol/L (ref 22–32)
pCO2, Ven: 48.5 mmHg (ref 44–60)
pH, Ven: 7.375 (ref 7.25–7.43)
pO2, Ven: 173 mmHg — ABNORMAL HIGH (ref 32–45)

## 2022-01-01 LAB — CBC WITH DIFFERENTIAL/PLATELET
Abs Immature Granulocytes: 0.02 10*3/uL (ref 0.00–0.07)
Basophils Absolute: 0 10*3/uL (ref 0.0–0.1)
Basophils Relative: 1 %
Eosinophils Absolute: 0.1 10*3/uL (ref 0.0–0.5)
Eosinophils Relative: 1 %
HCT: 46.6 % — ABNORMAL HIGH (ref 36.0–46.0)
Hemoglobin: 14.4 g/dL (ref 12.0–15.0)
Immature Granulocytes: 0 %
Lymphocytes Relative: 43 %
Lymphs Abs: 2.9 10*3/uL (ref 0.7–4.0)
MCH: 27.7 pg (ref 26.0–34.0)
MCHC: 30.9 g/dL (ref 30.0–36.0)
MCV: 89.8 fL (ref 80.0–100.0)
Monocytes Absolute: 0.4 10*3/uL (ref 0.1–1.0)
Monocytes Relative: 6 %
Neutro Abs: 3.3 10*3/uL (ref 1.7–7.7)
Neutrophils Relative %: 49 %
Platelets: 171 10*3/uL (ref 150–400)
RBC: 5.19 MIL/uL — ABNORMAL HIGH (ref 3.87–5.11)
RDW: 14.4 % (ref 11.5–15.5)
WBC: 6.6 10*3/uL (ref 4.0–10.5)
nRBC: 0 % (ref 0.0–0.2)

## 2022-01-01 LAB — BRAIN NATRIURETIC PEPTIDE: B Natriuretic Peptide: 1863.9 pg/mL — ABNORMAL HIGH (ref 0.0–100.0)

## 2022-01-01 LAB — D-DIMER, QUANTITATIVE: D-Dimer, Quant: 1.63 ug/mL-FEU — ABNORMAL HIGH (ref 0.00–0.50)

## 2022-01-01 MED ORDER — UMECLIDINIUM BROMIDE 62.5 MCG/ACT IN AEPB
1.0000 | INHALATION_SPRAY | Freq: Every day | RESPIRATORY_TRACT | Status: DC
Start: 2022-01-02 — End: 2022-01-04
  Administered 2022-01-02 – 2022-01-04 (×3): 1 via RESPIRATORY_TRACT
  Filled 2022-01-01: qty 7

## 2022-01-01 MED ORDER — SODIUM CHLORIDE 0.9 % IV SOLN: 250.0000 mL | INTRAVENOUS | Status: DC | PRN

## 2022-01-01 MED ORDER — SODIUM CHLORIDE 0.9% FLUSH
3.0000 mL | INTRAVENOUS | Status: DC | PRN
  Administered 2022-01-01: 3 mL via INTRAVENOUS

## 2022-01-01 MED ORDER — LEVALBUTEROL HCL 0.63 MG/3ML IN NEBU: 0.6300 mg | INHALATION_SOLUTION | Freq: Four times a day (QID) | RESPIRATORY_TRACT | Status: DC | PRN

## 2022-01-01 MED ORDER — POTASSIUM CHLORIDE CRYS ER 20 MEQ PO TBCR
40.0000 meq | EXTENDED_RELEASE_TABLET | Freq: Once | ORAL | Status: AC
  Administered 2022-01-01: 40 meq via ORAL
  Filled 2022-01-01: qty 2

## 2022-01-01 MED ORDER — ONDANSETRON HCL 4 MG/2ML IJ SOLN: 4.0000 mg | Freq: Four times a day (QID) | INTRAMUSCULAR | Status: DC | PRN

## 2022-01-01 MED ORDER — BACLOFEN 10 MG PO TABS
10.0000 mg | ORAL_TABLET | Freq: Three times a day (TID) | ORAL | Status: DC
  Administered 2022-01-02: 10 mg via ORAL
  Filled 2022-01-01 (×4): qty 1

## 2022-01-01 MED ORDER — ENOXAPARIN SODIUM 40 MG/0.4ML IJ SOSY
40.0000 mg | PREFILLED_SYRINGE | Freq: Every day | INTRAMUSCULAR | Status: DC
  Administered 2022-01-01: 40 mg via SUBCUTANEOUS
  Filled 2022-01-01: qty 0.4

## 2022-01-01 MED ORDER — DULOXETINE HCL 30 MG PO CPEP
30.0000 mg | ORAL_CAPSULE | Freq: Every day | ORAL | Status: DC
  Filled 2022-01-01: qty 1

## 2022-01-01 MED ORDER — ACETAMINOPHEN 325 MG PO TABS: 650.0000 mg | ORAL_TABLET | ORAL | Status: DC | PRN

## 2022-01-01 MED ORDER — METHYLPREDNISOLONE SODIUM SUCC 125 MG IJ SOLR
125.0000 mg | Freq: Once | INTRAMUSCULAR | Status: AC
  Administered 2022-01-01: 125 mg via INTRAVENOUS
  Filled 2022-01-01: qty 2

## 2022-01-01 MED ORDER — MORPHINE SULFATE (PF) 4 MG/ML IV SOLN
3.0000 mg | Freq: Once | INTRAVENOUS | Status: AC
Start: 2022-01-01 — End: 2022-01-01
  Administered 2022-01-01: 3 mg via INTRAVENOUS
  Filled 2022-01-01: qty 1

## 2022-01-01 MED ORDER — ROSUVASTATIN CALCIUM 20 MG PO TABS
40.0000 mg | ORAL_TABLET | Freq: Every day | ORAL | Status: DC
  Administered 2022-01-02 – 2022-01-04 (×3): 40 mg via ORAL
  Filled 2022-01-01 (×3): qty 2

## 2022-01-01 MED ORDER — OXYCODONE-ACETAMINOPHEN 5-325 MG PO TABS
1.0000 | ORAL_TABLET | Freq: Four times a day (QID) | ORAL | Status: DC | PRN
  Administered 2022-01-02 – 2022-01-04 (×9): 1 via ORAL
  Filled 2022-01-01 (×9): qty 1

## 2022-01-01 MED ORDER — FLUTICASONE FUROATE-VILANTEROL 200-25 MCG/ACT IN AEPB
1.0000 | INHALATION_SPRAY | Freq: Every day | RESPIRATORY_TRACT | Status: DC
  Administered 2022-01-02 – 2022-01-04 (×3): 1 via RESPIRATORY_TRACT
  Filled 2022-01-01: qty 28

## 2022-01-01 MED ORDER — IPRATROPIUM-ALBUTEROL 0.5-2.5 (3) MG/3ML IN SOLN
9.0000 mL | Freq: Once | RESPIRATORY_TRACT | Status: AC
  Administered 2022-01-01: 9 mL via RESPIRATORY_TRACT
  Filled 2022-01-01: qty 9
  Filled 2022-01-01: qty 3

## 2022-01-01 MED ORDER — FUROSEMIDE 10 MG/ML IJ SOLN
40.0000 mg | Freq: Once | INTRAMUSCULAR | Status: AC
  Administered 2022-01-01: 40 mg via INTRAVENOUS
  Filled 2022-01-01: qty 4

## 2022-01-01 MED ORDER — SODIUM CHLORIDE 0.9% FLUSH
3.0000 mL | Freq: Two times a day (BID) | INTRAVENOUS | Status: DC
  Administered 2022-01-01 – 2022-01-02 (×2): 3 mL via INTRAVENOUS

## 2022-01-01 MED ORDER — TRAZODONE HCL 50 MG PO TABS
50.0000 mg | ORAL_TABLET | Freq: Every day | ORAL | Status: DC
Start: 2022-01-01 — End: 2022-01-04
  Administered 2022-01-01 – 2022-01-03 (×3): 50 mg via ORAL
  Filled 2022-01-01 (×3): qty 1

## 2022-01-01 NOTE — Assessment & Plan Note (Addendum)
Exacerbation secondary to pulmonary edema associated with new onset CHF it appears. ?1. COPD pathway ?2. Got solumedrol in ED, but will hold off on further steroids (trying to diurese patient) for the moment ?3. Cont Trelegy ?4. PRN xopenex (tachy to 130s on arrival to ED) ?

## 2022-01-01 NOTE — Assessment & Plan Note (Signed)
Requiring CPAP ?

## 2022-01-01 NOTE — Assessment & Plan Note (Addendum)
Cont chronic percocet, baclofen ?

## 2022-01-01 NOTE — Assessment & Plan Note (Signed)
Checking FLP ?Cont crestor (looks like shes on 40mg  crestor chronically). ?

## 2022-01-01 NOTE — Assessment & Plan Note (Addendum)
Overall picture is concerning for new onset CHF: ?Patient presents with: dyspnea on exertion / increased shortness of breath ?Exam findings include: PULMONARY CRACKLES and tachycardia ?Work up findings include: PULMONARY EDEMA ON CXR ?Additionally: ? EKG shows LBBB with significant LVH ? CXR shows cardiomegaly ? Troponin's thus far are 57 and 102 ?However: ? Patient does not have peripheral edema ? Patient does not endorse orthopnea ?1. CHF pathway ?2. Cards consult (cards fellow is coming to consult tonight) ?3. Giving dose of lasix 40mg  IV x1 now - will defer to cards as to further diuretics ?1. Will also defer starting of BB / entresto to cards ?4. 2d echo ordered ?5. Strict intake and output ?6. Check BNP ?7. Daily BMP ?

## 2022-01-01 NOTE — ED Triage Notes (Signed)
Pt BIB EMS from home for respiraotry distress. Pt reports SOB all day, Pt was diaphoretic on EMS arrival with no chest pain. PTA pt given 15 mg albuterol and 1mg  Atrovent  ?Hx asthma, COPD ? ? ?12 lead LBB with some elevation  ?130 HR  ?157/99 ?100% CPAP ?

## 2022-01-01 NOTE — ED Provider Notes (Signed)
?[facility] EMERGENCY DEPARTMENT ?Provider Note ? ?CSN: [id number] ?Arrival date & time: 01/01/22 1917 ? ?Chief Complaint(s) ?Respiratory Distress ? ?HPI ?Nancy Marsh is a 66 y.o. female with PMH COPD, HTN, panic attacks who presents emergency department for evaluation of respiratory distress.  Patient states that she was at a family member's house and "pushed herself too hard" then went to the grocery store and had acute onset shortness of breath.  She was noted by EMS to have wheezing and received 1 DuoNeb prior to arrival and arrived on CPAP.  CPAP was discontinued immediately on arrival and patient did not have significant respiratory distress but did have persistent wheezing.  She denies chest pain, abdominal pain, nausea, vomiting or other systemic symptoms. ? ? ?Past Medical History ?Past Medical History:  ?Diagnosis Date  ? COPD (chronic obstructive pulmonary disease) (HCC)   ? Degenerative disc disease, lumbar   ? Hypertension   ? Neuropathy   ? Panic attack   ? ?Patient Active Problem List  ? Diagnosis Date Noted  ? LBBB (left bundle branch block) 01/01/2022  ? New onset of congestive heart failure (HCC) 01/01/2022  ? Respiratory distress 01/01/2022  ? Transaminitis 01/01/2022  ? HTN (hypertension) 01/01/2022  ? Chronic pain syndrome 01/01/2022  ? COPD with acute exacerbation (HCC) 01/01/2022  ? ?Home Medication(s) ?Prior to Admission medications   ?Medication Sig Start Date End Date Taking? Authorizing Provider  ?albuterol (VENTOLIN HFA) 108 (90 Base) MCG/ACT inhaler Inhale 1 puff into the lungs every 6 (six) hours as needed for wheezing or shortness of breath. 12/19/21  Yes Horton, Clabe Seal, DO  ?amLODipine (NORVASC) 5 MG tablet Take 1 tablet (5 mg total) by mouth daily. 10/19/16  Yes Fayrene Helper, PA-C  ?baclofen (LIORESAL) 10 MG tablet Take 1 tablet (10 mg total) by mouth 3 (three) times daily. 10/19/16  Yes Fayrene Helper, PA-C  ?DULoxetine (CYMBALTA) 30 MG capsule Take 1 capsule (30  mg total) by mouth daily. 10/19/16  Yes Fayrene Helper, PA-C  ?Fluticasone-Umeclidin-Vilant (TRELEGY ELLIPTA) 200-62.5-25 MCG/ACT AEPB Inhale 1 puff into the lungs daily. 12/19/21  Yes Horton, Clabe Seal, DO  ?oxyCODONE-acetaminophen (PERCOCET/ROXICET) 5-325 MG tablet Take 1 tablet by mouth every 6 (six) hours as needed for severe pain.   Yes [provider]  ?rosuvastatin (CRESTOR) 40 MG tablet Take 40 mg by mouth daily. 12/03/21  Yes [provider]  ?traZODone (DESYREL) 50 MG tablet Take 50 mg by mouth at bedtime.   Yes [provider]  ?Vitamin D, Ergocalciferol, (DRISDOL) 1.25 MG (50000 UNIT) CAPS capsule Take 50,000 Units by mouth every 7 (seven) days.   Yes [provider]  ?                                                                                                                                  ?Past Surgical History ?Past Surgical History:  ?Procedure Laterality Date  ? BACK SURGERY    ?  CHOLECYSTECTOMY    ? ?Family History ?No family history on file. ? ?Social History ?Social History  ? ?Tobacco Use  ? Smoking status: Every Day  ?  Packs/day: 0.50  ?  Types: Cigarettes  ? Smokeless tobacco: Never  ?Substance Use Topics  ? Alcohol use: No  ? ?Allergies ?Ibuprofen and Nsaids ? ?Review of Systems ?Review of Systems  ?Respiratory:  Positive for cough, shortness of breath and wheezing.   ? ?Physical Exam ?Vital Signs  ?I have reviewed the triage vital signs ?BP 135/85   Pulse 96   Temp 97.7 ?F (36.5 ?C) (Oral)   Resp (!) 23   Ht 5\' 4"  (1.626 m)   Wt 56.7 kg   SpO2 94%   BMI 21.46 kg/m?  ? ?Physical Exam ?Vitals and nursing note reviewed.  ?Constitutional:   ?   General: She is not in acute distress. ?   Appearance: She is well-developed. She is ill-appearing.  ?HENT:  ?   Head: Normocephalic and atraumatic.  ?Eyes:  ?   Conjunctiva/sclera: Conjunctivae normal.  ?Cardiovascular:  ?   Rate and Rhythm: Regular rhythm. Tachycardia present.  ?   Heart sounds: No murmur  heard. ?Pulmonary:  ?   Effort: Respiratory distress present.  ?   Breath sounds: Wheezing present.  ?Abdominal:  ?   Palpations: Abdomen is soft.  ?   Tenderness: There is no abdominal tenderness.  ?Musculoskeletal:     ?   General: No swelling.  ?   Cervical back: Neck supple.  ?Skin: ?   General: Skin is warm and dry.  ?   Capillary Refill: Capillary refill takes less than 2 seconds.  ?Neurological:  ?   Mental Status: She is alert.  ?Psychiatric:     ?   Mood and Affect: Mood normal.  ? ? ?ED Results and Treatments ?Labs ?(all labs ordered are listed, but only abnormal results are displayed) ?Labs Reviewed  ?COMPREHENSIVE METABOLIC PANEL - Abnormal; Notable for the following components:  ?    Result Value  ? Glucose, Bld 134 (*)   ? Calcium 8.8 (*)   ? Total Protein 6.3 (*)   ? Albumin 3.4 (*)   ? AST 105 (*)   ? ALT 138 (*)   ? All other components within normal limits  ?CBC WITH DIFFERENTIAL/PLATELET - Abnormal; Notable for the following components:  ? RBC 5.19 (*)   ? HCT 46.6 (*)   ? All other components within normal limits  ?D-DIMER, QUANTITATIVE - Abnormal; Notable for the following components:  ? D-Dimer, Quant 1.63 (*)   ? All other components within normal limits  ?I-STAT VENOUS BLOOD GAS, ED - Abnormal; Notable for the following components:  ? pO2, Ven 173 (*)   ? Bicarbonate 28.4 (*)   ? Calcium, Ion 1.01 (*)   ? Hemoglobin 15.3 (*)   ? All other components within normal limits  ?TROPONIN I (HIGH SENSITIVITY) - Abnormal; Notable for the following components:  ? Troponin I (High Sensitivity) 57 (*)   ? All other components within normal limits  ?TROPONIN I (HIGH SENSITIVITY) - Abnormal; Notable for the following components:  ? Troponin I (High Sensitivity) 102 (*)   ? All other components within normal limits  ?HIV ANTIBODY (ROUTINE TESTING W REFLEX)  ?BASIC METABOLIC PANEL  ?BRAIN NATRIURETIC PEPTIDE  ?TSH  ?HEPATIC FUNCTION PANEL  ?                                                                                                                        ? ?  Radiology ?DG Chest Portable 1 View ? ?Result Date: 01/01/2022 ?CLINICAL DATA:  Shortness of breath EXAM: PORTABLE CHEST 1 VIEW COMPARISON:  Chest x-ray dated December 19, 2021 FINDINGS: Unchanged cardiomegaly. Mild bilateral interstitial opacities. No focal consolidation. No large pleural effusion or pneumothorax. IMPRESSION: 1. Mild bilateral interstitial opacities, concerning for pulmonary edema. 2. Unchanged cardiomegaly. Electronically Signed   By: Allegra Lai M.D.   On: 01/01/2022 20:06   ? ?Pertinent labs & imaging results that were available during my care of the patient were reviewed by me and considered in my medical decision making (see MDM for details). ? ?Medications Ordered in ED ?Medications  ?sodium chloride flush (NS) 0.9 % injection 3 mL (3 mLs Intravenous Given 01/01/22 2343)  ?sodium chloride flush (NS) 0.9 % injection 3 mL (3 mLs Intravenous Given 01/01/22 2342)  ?0.9 %  sodium chloride infusion (has no administration in time range)  ?acetaminophen (TYLENOL) tablet 650 mg (has no administration in time range)  ?ondansetron (ZOFRAN) injection 4 mg (has no administration in time range)  ?enoxaparin (LOVENOX) injection 40 mg (40 mg Subcutaneous Given 01/01/22 2339)  ?fluticasone furoate-vilanterol (BREO ELLIPTA) 200-25 MCG/ACT 1 puff (has no administration in time range)  ?  And  ?umeclidinium bromide (INCRUSE ELLIPTA) 62.5 MCG/ACT 1 puff (has no administration in time range)  ?DULoxetine (CYMBALTA) DR capsule 30 mg (has no administration in time range)  ?baclofen (LIORESAL) tablet 10 mg (10 mg Oral Patient Refused/Not Given 01/01/22 2342)  ?oxyCODONE-acetaminophen (PERCOCET/ROXICET) 5-325 MG per tablet 1 tablet (has no administration in time range)  ?rosuvastatin (CRESTOR) tablet 40 mg (has no administration in time range)  ?traZODone (DESYREL) tablet 50 mg (50 mg Oral Given 01/01/22 2340)  ?levalbuterol (XOPENEX) nebulizer solution 0.63 mg (has no  administration in time range)  ?methylPREDNISolone sodium succinate (SOLU-MEDROL) 125 mg/2 mL injection 125 mg (125 mg Intravenous Given 01/01/22 2003)  ?ipratropium-albuterol (DUONEB) 0.5-2.5 (3) MG/3ML nebulize

## 2022-01-01 NOTE — H&P (Signed)
?History and Physical  ? ? ?PatientSanaa Marsh LFY:[id number] DOB: 1956/07/20 ?DOA: 01/01/2022 ?DOS: the patient was seen and examined on 01/01/2022 ?PCP: Ladora Daniel, PA-C  ?Patient coming from: Home ? ?Chief Complaint:  ?Chief Complaint  ?Patient presents with  ? Respiratory Distress  ? ?HPI: Nancy Marsh is a 66 y.o. female with medical history significant of HTN, COPD, smoking, HLD. ? ?Pt presents to ED with c/o respiratory distress. ? ? Patient states that she was at a family member's house and "pushed herself too hard" then went to the grocery store and had acute onset shortness of breath.  She was noted by EMS to have wheezing and received 1 DuoNeb prior to arrival and arrived on CPAP.  CPAP was discontinued immediately on arrival and patient did not have significant respiratory distress but did have persistent wheezing.  She denies chest pain, abdominal pain, nausea, vomiting or other systemic symptoms. ? ?Pt does state that she has palpitations. ? ?Pt denies orthopnea or peripheral edema. ? ?Pt notes that her SOB gets significantly worse with any activity. ?  ?Review of Systems: As mentioned in the history of present illness. All other systems reviewed and are negative. ?Past Medical History:  ?Diagnosis Date  ? COPD (chronic obstructive pulmonary disease) (HCC)   ? Degenerative disc disease, lumbar   ? Hypertension   ? Neuropathy   ? Panic attack   ? ?Past Surgical History:  ?Procedure Laterality Date  ? BACK SURGERY    ? CHOLECYSTECTOMY    ? ?Social History:  reports that she has been smoking cigarettes. She has been smoking an average of .5 packs per day. She has never used smokeless tobacco. She reports that she does not drink alcohol. No history on file for drug use. ? ?Allergies  ?Allergen Reactions  ? Ibuprofen Anaphylaxis  ? Nsaids Anaphylaxis  ? ? ?No family history on file. ? ?Prior to Admission medications   ?Medication Sig Start Date End Date Taking? Authorizing Provider  ?albuterol  (VENTOLIN HFA) 108 (90 Base) MCG/ACT inhaler Inhale 1 puff into the lungs every 6 (six) hours as needed for wheezing or shortness of breath. 12/19/21  Yes Horton, Clabe Seal, DO  ?amLODipine (NORVASC) 5 MG tablet Take 1 tablet (5 mg total) by mouth daily. 10/19/16  Yes Fayrene Helper, PA-C  ?baclofen (LIORESAL) 10 MG tablet Take 1 tablet (10 mg total) by mouth 3 (three) times daily. 10/19/16  Yes Fayrene Helper, PA-C  ?DULoxetine (CYMBALTA) 30 MG capsule Take 1 capsule (30 mg total) by mouth daily. 10/19/16  Yes Fayrene Helper, PA-C  ?Fluticasone-Umeclidin-Vilant (TRELEGY ELLIPTA) 200-62.5-25 MCG/ACT AEPB Inhale 1 puff into the lungs daily. 12/19/21  Yes Horton, Clabe Seal, DO  ?oxyCODONE-acetaminophen (PERCOCET/ROXICET) 5-325 MG tablet Take 1 tablet by mouth every 6 (six) hours as needed for severe pain.   Yes [provider]  ?rosuvastatin (CRESTOR) 40 MG tablet Take 40 mg by mouth daily. 12/03/21  Yes [provider]  ?traZODone (DESYREL) 50 MG tablet Take 50 mg by mouth at bedtime.   Yes [provider]  ?Vitamin D, Ergocalciferol, (DRISDOL) 1.25 MG (50000 UNIT) CAPS capsule Take 50,000 Units by mouth every 7 (seven) days.   Yes [provider]  ? ? ?Physical Exam: ?Vitals:  ? 01/01/22 2115 01/01/22 2145 01/01/22 2215 01/01/22 2245  ?BP: (!) 145/91 139/79 125/80 135/85  ?Pulse: 87 84 93 96  ?Resp: (!) 25 (!) 26 (!) 23 (!) 23  ?Temp:      ?TempSrc:      ?  SpO2: 92% 92% 92% 94%  ?Weight:      ?Height:      ? ?Constitutional: Ill appearing ?Eyes: PERRL, lids and conjunctivae normal ?ENMT: Mucous membranes are moist. Posterior pharynx clear of any exudate or lesions.Normal dentition.  ?Neck: normal, supple, no masses, no thyromegaly ?Respiratory: Diffuse crackles B, some wheezes ?Cardiovascular: Regular rhythm but tachycardic 100-110. No extremity edema. 2+ pedal pulses. No carotid bruits.  ?Abdomen: no tenderness, no masses palpated. No hepatosplenomegaly. Bowel sounds positive.  ?Musculoskeletal:  no clubbing / cyanosis. No joint deformity upper and lower extremities. Good ROM, no contractures. Normal muscle tone.  ?Skin: no rashes, lesions, ulcers. No induration ?Neurologic: CN 2-12 grossly intact. Sensation intact, DTR normal. Strength 5/5 in all 4.  ?Psychiatric: Normal judgment and insight. Alert and oriented x 3. Normal mood.  ? ?Data Reviewed: ? ?Trops of 57 and 102 ?EKG with LBBB (old) and LVH ?CXR with pulm edema and cardiomegaly ?CMP showing AST of 105, ALT 138 ? ?Assessment and Plan: ?* New onset of congestive heart failure (HCC) ?Overall picture is concerning for new onset CHF: ?Patient presents with: dyspnea on exertion / increased shortness of breath ?Exam findings include: PULMONARY CRACKLES and tachycardia ?Work up findings include: PULMONARY EDEMA ON CXR ?Additionally: ? EKG shows LBBB with significant LVH ? CXR shows cardiomegaly ? Troponin's thus far are 57 and 102 ?CHF pathway ?Cards consult (cards fellow is coming to consult tonight) ?Giving dose of lasix 40mg  IV x1 now - will defer to cards as to further diuretics ?Will also defer starting of BB / entresto to cards ?2d echo ordered ?Strict intake and output ?Daily BMP ? ?COPD with acute exacerbation (HCC) ?Exacerbation secondary to pulmonary edema associated with new onset CHF it appears. ?COPD pathway ?Got solumedrol in ED, but will hold off on further steroids (trying to diurese patient) for the moment ?Cont Trelegy ?PRN xopenex (tachy to 130s on arrival to ED) ? ?Transaminitis ?Mild LFT elevations. ?Question hepatic congestion due to CHF? ?Repeat LFTs tomorrow AM ? ?Respiratory distress ?Requiring CPAP ? ?Chronic pain syndrome ?Cont chronic percocet, baclofen ? ?HTN (hypertension) ?Holding norvasc. ?If BP permits, presumably cards will want to start BB, Entresto, or both. ? ?LBBB (left bundle branch block) ?Also present on [facility] EKG in 2021. ? ? ? ? ? Advance Care Planning:   Code Status: Full Code ? ?Consults: Cardiology coming to  see pt ? ?Family Communication: Family at bedside ? ?Severity of Illness: ?The appropriate patient status for this patient is INPATIENT. Inpatient status is judged to be reasonable and necessary in order to provide the required intensity of service to ensure the patient's safety. The patient's presenting symptoms, physical exam findings, and initial radiographic and laboratory data in the context of their chronic comorbidities is felt to place them at high risk for further clinical deterioration. Furthermore, it is not anticipated that the patient will be medically stable for discharge from the hospital within 2 midnights of admission.  ? ?* I certify that at the point of admission it is my clinical judgment that the patient will require inpatient hospital care spanning beyond 2 midnights from the point of admission due to high intensity of service, high risk for further deterioration and high frequency of surveillance required.* ? ?Author: ?2022., DO ?01/01/2022 11:48 PM ? ?For on call review [email or web address].  ?

## 2022-01-01 NOTE — Assessment & Plan Note (Signed)
Also present on [facility] EKG in 2021. ?

## 2022-01-01 NOTE — Assessment & Plan Note (Signed)
Holding norvasc. ?If BP permits, presumably cards will want to start BB, Entresto, or both. ?

## 2022-01-01 NOTE — Assessment & Plan Note (Signed)
Mild LFT elevations. ?Question hepatic congestion due to CHF? ?1. Repeat LFTs tomorrow AM ?

## 2022-01-02 ENCOUNTER — Encounter (HOSPITAL_COMMUNITY)
Admission: EM | Disposition: A | Discharge status: Home / Self Care | Payer: Self-pay | Source: Home / Self Care | Attending: Family Medicine

## 2022-01-02 ENCOUNTER — Inpatient Hospital Stay (HOSPITAL_COMMUNITY): Payer: Medicare Other

## 2022-01-02 ENCOUNTER — Encounter (HOSPITAL_COMMUNITY): Payer: Self-pay | Admitting: Internal Medicine

## 2022-01-02 DIAGNOSIS — I2510 Atherosclerotic heart disease of native coronary artery without angina pectoris: Secondary | ICD-10-CM

## 2022-01-02 DIAGNOSIS — I5021 Acute systolic (congestive) heart failure: Secondary | ICD-10-CM | POA: Diagnosis not present

## 2022-01-02 DIAGNOSIS — E440 Moderate protein-calorie malnutrition: Secondary | ICD-10-CM | POA: Insufficient documentation

## 2022-01-02 HISTORY — PX: RIGHT/LEFT HEART CATH AND CORONARY ANGIOGRAPHY: CATH118266

## 2022-01-02 LAB — POCT I-STAT EG7
Acid-Base Excess: 1 mmol/L (ref 0.0–2.0)
Acid-Base Excess: 2 mmol/L (ref 0.0–2.0)
Bicarbonate: 26.9 mmol/L (ref 20.0–28.0)
Bicarbonate: 28.2 mmol/L — ABNORMAL HIGH (ref 20.0–28.0)
Calcium, Ion: 1.08 mmol/L — ABNORMAL LOW (ref 1.15–1.40)
Calcium, Ion: 1.23 mmol/L (ref 1.15–1.40)
HCT: 42 % (ref 36.0–46.0)
HCT: 44 % (ref 36.0–46.0)
Hemoglobin: 14.3 g/dL (ref 12.0–15.0)
Hemoglobin: 15 g/dL (ref 12.0–15.0)
O2 Saturation: 62 %
O2 Saturation: 58 %
Potassium: 4 mmol/L (ref 3.5–5.1)
Potassium: 4.3 mmol/L (ref 3.5–5.1)
Sodium: 142 mmol/L (ref 135–145)
Sodium: 140 mmol/L (ref 135–145)
TCO2: 28 mmol/L (ref 22–32)
TCO2: 30 mmol/L (ref 22–32)
pCO2, Ven: 44.9 mmHg (ref 44–60)
pCO2, Ven: 47.5 mmHg (ref 44–60)
pH, Ven: 7.386 (ref 7.25–7.43)
pH, Ven: 7.382 (ref 7.25–7.43)
pO2, Ven: 33 mmHg (ref 32–45)
pO2, Ven: 31 mmHg — CL (ref 32–45)

## 2022-01-02 LAB — POCT I-STAT 7, (LYTES, BLD GAS, ICA,H+H)
Acid-Base Excess: 2 mmol/L (ref 0.0–2.0)
Bicarbonate: 26.5 mmol/L (ref 20.0–28.0)
Calcium, Ion: 1.16 mmol/L (ref 1.15–1.40)
HCT: 43 % (ref 36.0–46.0)
Hemoglobin: 14.6 g/dL (ref 12.0–15.0)
O2 Saturation: 93 %
Potassium: 4.3 mmol/L (ref 3.5–5.1)
Sodium: 139 mmol/L (ref 135–145)
TCO2: 28 mmol/L (ref 22–32)
pCO2 arterial: 40.3 mmHg (ref 32–48)
pH, Arterial: 7.426 (ref 7.35–7.45)
pO2, Arterial: 64 mmHg — ABNORMAL LOW (ref 83–108)

## 2022-01-02 LAB — BASIC METABOLIC PANEL
Anion gap: 9 (ref 5–15)
BUN: 13 mg/dL (ref 8–23)
CO2: 24 mmol/L (ref 22–32)
Calcium: 8.8 mg/dL — ABNORMAL LOW (ref 8.9–10.3)
Chloride: 106 mmol/L (ref 98–111)
Creatinine, Ser: 0.89 mg/dL (ref 0.44–1.00)
GFR, Estimated: >60 mL/min (ref >60)
Glucose, Bld: 193 mg/dL — ABNORMAL HIGH (ref 70–99)
Potassium: 3.6 mmol/L (ref 3.5–5.1)
Sodium: 139 mmol/L (ref 135–145)

## 2022-01-02 LAB — LIPID PANEL
Cholesterol: 248 mg/dL — ABNORMAL HIGH (ref 0–200)
HDL: 73 mg/dL (ref >40)
LDL Cholesterol: 167 mg/dL — ABNORMAL HIGH (ref 0–99)
Total CHOL/HDL Ratio: 3.4 RATIO
Triglycerides: 38 mg/dL (ref <150)
VLDL: 8 mg/dL (ref 0–40)

## 2022-01-02 LAB — HEPATIC FUNCTION PANEL
ALT: 135 U/L — ABNORMAL HIGH (ref 0–44)
AST: 75 U/L — ABNORMAL HIGH (ref 15–41)
Albumin: 3.4 g/dL — ABNORMAL LOW (ref 3.5–5.0)
Alkaline Phosphatase: 92 U/L (ref 38–126)
Bilirubin, Direct: 0.1 mg/dL (ref 0.0–0.2)
Indirect Bilirubin: 0.5 mg/dL (ref 0.3–0.9)
Total Bilirubin: 0.6 mg/dL (ref 0.3–1.2)
Total Protein: 6.4 g/dL — ABNORMAL LOW (ref 6.5–8.1)

## 2022-01-02 LAB — ECHOCARDIOGRAM COMPLETE
AR max vel: 1.66 cm2
AV Peak grad: 9.1 mmHg
Ao pk vel: 1.51 m/s
Area-P 1/2: 7.09 cm2
Calc EF: 26.7 %
Height: 64 in
MV M vel: 3.76 m/s
MV Peak grad: 56.6 mmHg
S' Lateral: 4.9 cm
Single Plane A2C EF: 24.7 %
Single Plane A4C EF: 27.2 %
Weight: 1952 oz

## 2022-01-02 LAB — TSH: TSH: 0.642 u[IU]/mL (ref 0.350–4.500)

## 2022-01-02 LAB — HEMOGLOBIN A1C
Hgb A1c MFr Bld: 6.5 % — ABNORMAL HIGH (ref 4.8–5.6)
Mean Plasma Glucose: 139.85 mg/dL

## 2022-01-02 LAB — HIV ANTIBODY (ROUTINE TESTING W REFLEX): HIV Screen 4th Generation wRfx: NONREACTIVE

## 2022-01-02 SURGERY — RIGHT/LEFT HEART CATH AND CORONARY ANGIOGRAPHY
Anesthesia: LOCAL

## 2022-01-02 MED ORDER — SODIUM CHLORIDE 0.9 % IV SOLN: INTRAVENOUS | Status: DC

## 2022-01-02 MED ORDER — ASPIRIN 81 MG PO CHEW
81.0000 mg | CHEWABLE_TABLET | Freq: Once | ORAL | Status: AC
  Administered 2022-01-02: 81 mg via ORAL
  Filled 2022-01-02: qty 1

## 2022-01-02 MED ORDER — HEPARIN (PORCINE) IN NACL 1000-0.9 UT/500ML-% IV SOLN
INTRAVENOUS | Status: DC | PRN
  Administered 2022-01-02 (×2): 500 mL

## 2022-01-02 MED ORDER — ADULT MULTIVITAMIN W/MINERALS CH
1.0000 | ORAL_TABLET | Freq: Every day | ORAL | Status: DC
  Administered 2022-01-02 – 2022-01-04 (×3): 1 via ORAL
  Filled 2022-01-02 (×3): qty 1

## 2022-01-02 MED ORDER — SODIUM CHLORIDE 0.9% FLUSH: 3.0000 mL | Freq: Two times a day (BID) | INTRAVENOUS | Status: DC

## 2022-01-02 MED ORDER — VERAPAMIL HCL 2.5 MG/ML IV SOLN
INTRAVENOUS | Status: DC | PRN
  Administered 2022-01-02: 10 mL via INTRA_ARTERIAL

## 2022-01-02 MED ORDER — MIDAZOLAM HCL 2 MG/2ML IJ SOLN
INTRAMUSCULAR | Status: AC
  Filled 2022-01-02: qty 2

## 2022-01-02 MED ORDER — MIDAZOLAM HCL 2 MG/2ML IJ SOLN
INTRAMUSCULAR | Status: DC | PRN
Start: 2022-01-02 — End: 2022-01-02
  Administered 2022-01-02: 1 mg via INTRAVENOUS

## 2022-01-02 MED ORDER — BOOST / RESOURCE BREEZE PO LIQD CUSTOM
1.0000 | Freq: Three times a day (TID) | ORAL | Status: DC
  Administered 2022-01-03 – 2022-01-04 (×4): 1 via ORAL

## 2022-01-02 MED ORDER — SODIUM CHLORIDE 0.9 % IV SOLN: 250.0000 mL | INTRAVENOUS | Status: DC | PRN

## 2022-01-02 MED ORDER — VERAPAMIL HCL 2.5 MG/ML IV SOLN
INTRAVENOUS | Status: AC
  Filled 2022-01-02: qty 2

## 2022-01-02 MED ORDER — PERFLUTREN LIPID MICROSPHERE
1.0000 mL | INTRAVENOUS | Status: AC | PRN
  Administered 2022-01-02: 2 mL via INTRAVENOUS
  Filled 2022-01-02: qty 10

## 2022-01-02 MED ORDER — FUROSEMIDE 10 MG/ML IJ SOLN
40.0000 mg | Freq: Once | INTRAMUSCULAR | Status: AC
  Administered 2022-01-02: 40 mg via INTRAVENOUS
  Filled 2022-01-02: qty 4

## 2022-01-02 MED ORDER — HEPARIN SODIUM (PORCINE) 1000 UNIT/ML IJ SOLN
INTRAMUSCULAR | Status: AC
Start: 2022-01-02 — End: ?
  Filled 2022-01-02: qty 10

## 2022-01-02 MED ORDER — IOHEXOL 350 MG/ML SOLN
INTRAVENOUS | Status: DC | PRN
  Administered 2022-01-02: 55 mL via INTRA_ARTERIAL

## 2022-01-02 MED ORDER — HEPARIN (PORCINE) IN NACL 1000-0.9 UT/500ML-% IV SOLN
INTRAVENOUS | Status: AC
Start: 2022-01-02 — End: ?
  Filled 2022-01-02: qty 1000

## 2022-01-02 MED ORDER — EMPAGLIFLOZIN 10 MG PO TABS
10.0000 mg | ORAL_TABLET | Freq: Every day | ORAL | Status: DC
  Administered 2022-01-03 – 2022-01-04 (×2): 10 mg via ORAL
  Filled 2022-01-02 (×2): qty 1

## 2022-01-02 MED ORDER — LIDOCAINE HCL (PF) 1 % IJ SOLN
INTRAMUSCULAR | Status: DC | PRN
  Administered 2022-01-02 (×2): 2 mL

## 2022-01-02 MED ORDER — FENTANYL CITRATE (PF) 100 MCG/2ML IJ SOLN
INTRAMUSCULAR | Status: DC | PRN
  Administered 2022-01-02: 25 ug via INTRAVENOUS

## 2022-01-02 MED ORDER — HEPARIN SODIUM (PORCINE) 1000 UNIT/ML IJ SOLN
INTRAMUSCULAR | Status: DC | PRN
  Administered 2022-01-02: 3000 [IU] via INTRAVENOUS

## 2022-01-02 MED ORDER — SODIUM CHLORIDE 0.9% FLUSH: 3.0000 mL | INTRAVENOUS | Status: DC | PRN

## 2022-01-02 MED ORDER — LIDOCAINE HCL (PF) 1 % IJ SOLN
INTRAMUSCULAR | Status: AC
  Filled 2022-01-02: qty 30

## 2022-01-02 MED ORDER — ENOXAPARIN SODIUM 40 MG/0.4ML IJ SOSY
40.0000 mg | PREFILLED_SYRINGE | INTRAMUSCULAR | Status: DC
  Administered 2022-01-03: 40 mg via SUBCUTANEOUS
  Filled 2022-01-02 (×2): qty 0.4

## 2022-01-02 MED ORDER — FENTANYL CITRATE (PF) 100 MCG/2ML IJ SOLN
INTRAMUSCULAR | Status: AC
  Filled 2022-01-02: qty 2

## 2022-01-02 MED ORDER — NICOTINE 14 MG/24HR TD PT24
14.0000 mg | MEDICATED_PATCH | Freq: Every day | TRANSDERMAL | Status: DC
  Administered 2022-01-02 – 2022-01-04 (×3): 14 mg via TRANSDERMAL
  Filled 2022-01-02 (×3): qty 1

## 2022-01-02 MED ORDER — SODIUM CHLORIDE 0.9% FLUSH
3.0000 mL | Freq: Two times a day (BID) | INTRAVENOUS | Status: DC
  Administered 2022-01-02: 3 mL via INTRAVENOUS

## 2022-01-02 MED ORDER — SACUBITRIL-VALSARTAN 24-26 MG PO TABS
1.0000 | ORAL_TABLET | Freq: Two times a day (BID) | ORAL | Status: DC
  Administered 2022-01-02 – 2022-01-04 (×4): 1 via ORAL
  Filled 2022-01-02 (×4): qty 1

## 2022-01-02 SURGICAL SUPPLY — 13 items
CATH 5FR JL3.5 JR4 ANG PIG MP (CATHETERS) ×1 IMPLANT
CATH BALLN WEDGE 5F 110CM (CATHETERS) ×1 IMPLANT
DEVICE RAD TR BAND REGULAR (VASCULAR PRODUCTS) ×1 IMPLANT
GLIDESHEATH SLEND SS 6F .021 (SHEATH) ×1 IMPLANT
GUIDEWIRE .025 260CM (WIRE) ×1 IMPLANT
GUIDEWIRE INQWIRE 1.5J.035X260 (WIRE) IMPLANT
INQWIRE 1.5J .035X260CM (WIRE) ×2
KIT HEART LEFT (KITS) ×2 IMPLANT
PACK CARDIAC CATHETERIZATION (CUSTOM PROCEDURE TRAY) ×2 IMPLANT
SHEATH GLIDE SLENDER 4/5FR (SHEATH) ×1 IMPLANT
SHEATH PROBE COVER 6X72 (BAG) ×1 IMPLANT
TRANSDUCER W/STOPCOCK (MISCELLANEOUS) ×2 IMPLANT
TUBING CIL FLEX 10 FLL-RA (TUBING) ×2 IMPLANT

## 2022-01-02 NOTE — Progress Notes (Signed)
Initial Nutrition Assessment ? ?DOCUMENTATION CODES:  ?Non-severe (moderate) malnutrition in context of chronic illness ? ?INTERVENTION:  ?Advance diet to 2g Na after procedure, encourage PO intake ?Carnation Instant Breakfast BID- each packet provides 130kcal and 5g protein + lactose free milk  ?MVI with minerals daily ?Boost Breeze po TID, each supplement provides 250 kcal and 9 grams of protein ? ?NUTRITION DIAGNOSIS:  ?Moderate Malnutrition related to chronic illness (COPD) as evidenced by mild muscle depletion, mild fat depletion. ? ?GOAL:  ?Patient will meet greater than or equal to 90% of their needs ? ?MONITOR:  ?PO intake, Supplement acceptance, Diet advancement, Labs, I & O's ? ?REASON FOR ASSESSMENT:  ?Consult ?Assessment of nutrition requirement/status ? ?ASSESSMENT:  ?66 y.o. female with hx of HTN, HLD, COPD, and smoking presented to ED with respiratory distress. Workup suggestive of new onset CHF.   ? ?Pt resting in bed at the time of assessment, daughter at bedside. Pt was recently made NPO and cardiology plans to take pt down for cath this afternoon.  ? ?Discussed intake at home. Pt reports appetite has been normal but daughter clarifies that pt consumes very little food at baseline. Pt reports that she typically doesn't eat a meal until dinner time, will cook herself a meal such as steak with a baked potato and salad.  ? ?Muscle and fat deficit present on exam.  ? ?Pt reports that she does not like the milk-based ensure, but is willing to try boost breeze. Also agreeable to carnation instant breakfast but prefers lactose free milk as normal milk causes GI distress.  ? ?Nutritionally Relevant Medications: ?Scheduled Meds: ? baclofen  10 mg Oral TID  ? rosuvastatin  40 mg Oral Daily  ? ?PRN Meds: ondansetron ? ?Labs Reviewed ? ?NUTRITION - FOCUSED PHYSICAL EXAM: ?Flowsheet Row Most Recent Value  ?Orbital Region Mild depletion  ?Upper Arm Region Mild depletion  ?Thoracic and Lumbar Region Mild  depletion  ?Buccal Region Mild depletion  ?Temple Region Mild depletion  ?Clavicle Bone Region Mild depletion  ?Clavicle and Acromion Bone Region Mild depletion  ?Scapular Bone Region Mild depletion  ?Dorsal Hand No depletion  ?Patellar Region No depletion  ?Anterior Thigh Region No depletion  ?Posterior Calf Region No depletion  ?Edema (RD Assessment) None  ?Hair Reviewed  ?Eyes Reviewed  ?Mouth Reviewed  ?Skin Reviewed  ?Nails Reviewed  ? ?Diet Order:   ?Diet Order   ? ?       ?  Diet NPO time specified  Diet effective now       ?  ? ?  ?  ? ?  ? ? ?EDUCATION NEEDS:  ?Education needs have been addressed ? ?Skin:  Skin Assessment: Reviewed RN Assessment ? ?Last BM:  4/27 ? ?Height:  ?Ht Readings from Last 1 Encounters:  ?01/02/22 5\' 4"  (1.626 m)  ? ? ?Weight:  ?Wt Readings from Last 1 Encounters:  ?01/02/22 55.3 kg  ? ? ?Ideal Body Weight:  54.5 kg ? ?BMI:  Body mass index is 20.94 kg/m?. ? ?Estimated Nutritional Needs:  ?Kcal:  1600-1800 kcal/d ?Protein:  80-100 g/d ?Fluid:  1.8-2L/d ? ? ?Ranell Patrick, RD, LDN ?Clinical Dietitian ?RD pager # available in [place]  ?After hours/weekend pager # available in [place] ?

## 2022-01-02 NOTE — Interval H&P Note (Signed)
History and Physical Interval Note: ? ?01/02/2022 ?3:56 PM ? ?[facility]  has presented today for surgery, with the diagnosis of chest pain.  The various methods of treatment have been discussed with the patient and family. After consideration of risks, benefits and other options for treatment, the patient has consented to  Procedure(s): ?RIGHT/LEFT HEART CATH AND CORONARY ANGIOGRAPHY (N/A) as a surgical intervention.  The patient's history has been reviewed, patient examined, no change in status, stable for surgery.  I have reviewed the patient's chart and labs.  Questions were answered to the patient's satisfaction.   ? ? ?Nancy Marsh ?01/02/2022 ?3:56 PM ?Cath Lab Visit (complete for each Cath Lab visit) ? ?Clinical Evaluation Leading to the Procedure:  ? ?ACS: No. ? ?Non-ACS:   ? ?Anginal Classification: CCS II ? ?Anti-ischemic medical therapy: No Therapy ? ?Non-Invasive Test Results: No non-invasive testing performed ? ?Prior CABG: No previous CABG ? ? ? ? ? ? ? ? ?

## 2022-01-02 NOTE — Evaluation (Signed)
Physical Therapy Evaluation/ Discharge ?Patient Details ?Name: Nancy Marsh ?MRN: [id number] ?DOB: Sep 25, 1955 ?Today's Date: 01/02/2022 ? ?History of Present Illness ? 66 yo female admitted 4/27 with SOB. Pt with respiratory distress vs acute onset CHF. PMHx: HTN, COPD, HLD, LBBB, chronic back pain  ?Clinical Impression ? Pt very pleasant and reports increasing fatigue with activity for 6 months with acute worsening over the last 2 weeks. Pt educated for energy conservation and walking program as well as encouraged to have home pulse ox for monitoring. Pt with initial sitting EOB and HR up to 148 with static sitting, maintained with pt reporting dizziness with BP 113/85. Pt returned to supine with HR back to 90 at rest and able to again sit without return of tachycardia and complete remainder of mobility with VSS. All education completed with pt able to verbalize understanding. Will sign off with pt aware and agreeable.  ? ?   ? ?Recommendations for follow up therapy are one component of a multi-disciplinary discharge planning process, led by the attending physician.  Recommendations may be updated based on patient status, additional functional criteria and insurance authorization. ? ?Follow Up Recommendations No PT follow up ? ?  ?Assistance Recommended at Discharge None  ?Patient can return home with the following ?   ? ?  ?Equipment Recommendations None recommended by PT  ?Recommendations for Other Services ?    ?  ?Functional Status Assessment Patient has not had a recent decline in their functional status  ? ?  ?Precautions / Restrictions Precautions ?Precautions: Other (comment) ?Precaution Comments: watch HR  ? ?  ? ?Mobility ? Bed Mobility ?Overal bed mobility: Independent ?  ?  ?  ?  ?  ?  ?  ?  ? ?Transfers ?Overall transfer level: Independent ?  ?  ?  ?  ?  ?  ?  ?  ?  ?  ? ?Ambulation/Gait ?Ambulation/Gait assistance: Independent ?Gait Distance (Feet): 300 Feet ?Assistive device: None ?Gait  Pattern/deviations: WFL(Within Functional Limits) ?  ?Gait velocity interpretation: >2.62 ft/sec, indicative of community ambulatory ?  ?General Gait Details: pt able to walk without assist with good balance and speed. HR 110, SpO2 95% RA ? ?Stairs ?Stairs: Yes ?Stairs assistance: Modified independent (Device/Increase time) ?Stair Management: Alternating pattern, Forwards, One rail Left ?Number of Stairs: 11 ?General stair comments: slow steady ascent/descent with rail, no SOB or dizziness ? ?Wheelchair Mobility ?  ? ?Modified Rankin (Stroke Patients Only) ?  ? ?  ? ?Balance Overall balance assessment: No apparent balance deficits (not formally assessed) ?  ?  ?  ?  ?  ?  ?  ?  ?  ?  ?  ?  ?  ?  ?  ?  ?  ?  ?   ? ? ? ?Pertinent Vitals/Pain Pain Assessment ?Pain Assessment: No/denies pain  ? ? ?Home Living Family/patient expects to be discharged to:: Private residence ?Living Arrangements: Children ?Available Help at Discharge: Family;Available PRN/intermittently ?Type of Home: Apartment ?Home Access: Stairs to enter ?  ?Entrance Stairs-Number of Steps: 14 ?  ?Home Layout: One level ?Home Equipment: None ?   ?  ?Prior Function Prior Level of Function : Independent/Modified Independent ?  ?  ?  ?  ?  ?  ?Mobility Comments: pt reports increased fatigue for the last 6 months with activity >20 min such as cleaning and fatigue with stairs ?  ?  ? ? ?Hand Dominance  ?   ? ?  ?Extremity/Trunk Assessment  ?  Upper Extremity Assessment ?Upper Extremity Assessment: Overall WFL for tasks assessed ?  ? ?Lower Extremity Assessment ?Lower Extremity Assessment: Overall WFL for tasks assessed ?  ? ?Cervical / Trunk Assessment ?Cervical / Trunk Assessment: Normal  ?Communication  ?    ?Cognition Arousal/Alertness: Awake/alert ?Behavior During Therapy: [facility] for tasks assessed/performed ?Overall Cognitive Status: Within Functional Limits for tasks assessed ?  ?  ?  ?  ?  ?  ?  ?  ?  ?  ?  ?  ?  ?  ?  ?  ?  ?  ?  ? ?  ?General Comments    ? ?  ?Exercises    ? ?Assessment/Plan  ?  ?PT Assessment Patient does not need any further PT services  ?PT Problem List   ? ?   ?  ?PT Treatment Interventions     ? ?PT Goals (Current goals can be found in the Care Plan section)  ?Acute Rehab PT Goals ?PT Goal Formulation: All assessment and education complete, DC therapy ? ?  ?Frequency   ?  ? ? ?Co-evaluation   ?  ?  ?  ?  ? ? ?  ?AM-PAC PT "6 Clicks" Mobility  ?Outcome Measure Help needed turning from your back to your side while in a flat bed without using bedrails?: None ?Help needed moving from lying on your back to sitting on the side of a flat bed without using bedrails?: None ?Help needed moving to and from a bed to a chair (including a wheelchair)?: None ?Help needed standing up from a chair using your arms (e.g., wheelchair or bedside chair)?: None ?Help needed to walk in hospital room?: None ?Help needed climbing 3-5 steps with a railing? : None ?6 Click Score: 24 ? ?  ?End of Session   ?Activity Tolerance: Patient tolerated treatment well ?Patient left: in chair;with call bell/phone within reach ?Nurse Communication: Mobility status ?PT Visit Diagnosis: Other abnormalities of gait and mobility (R26.89) ?  ? ?Time: [id number] ?PT Time Calculation (min) (ACUTE ONLY): 30 min ? ? ?Charges:   PT Evaluation ?$PT Eval Moderate Complexity: 1 Mod ?PT Treatments ?$Therapeutic Activity: 8-22 mins ?  ?   ? ? ?Etai Copado P, PT ?Acute Rehabilitation Services ?Pager: [phone] ?Office: [phone] ? ? ?Nancy Marsh ?01/02/2022, 10:49 AM ? ?

## 2022-01-02 NOTE — H&P (View-Only) (Signed)
? ?  Fellow note from this morning reviewed- agree with findings. Concern for CHF with mild troponin elevation. Echo performed today - I personally reviewed, this demonstrates global hypokinesis with LVEF around 20-25%. Good response to diuretics reported, but only net negative 300 ml on the chart. Weight is down 1 kg.  I discussed the echo findings today with the patient - my recommendation is for [facility] today. She is in agreement with this. Keep NPO.  ? ?CARDIAC CATHETERIZATION CONSENT ? ?The procedure with Risks/Benefits/Alternatives and Indications was reviewed with the patient. All questions were answered.   ? ?Risks / Complications include, but not limited to: Death, MI, CVA/TIA, VF/VT (with defibrillation), Bradycardia (need for temporary pacer placement), contrast induced nephropathy, bleeding / bruising / hematoma / pseudoaneurysm, vascular or coronary injury (with possible emergent CT or Vascular Surgery), adverse medication reactions, infection.   ? ?The patient voiced understanding and agreed to proceed.  I have signed the consent form and placed it on the chart for patient signature and RN witness.   ? ?Pixie Casino, MD, [facility], FACP  ?[place]  ?Medical Director of the Advanced Lipid Disorders &  ?Cardiovascular Risk Reduction Clinic ?Diplomate of the [employer] of Clinical Lipidology ?Attending Cardiologist  ?Direct Dial: [phone]  Fax: [phone]  ?Website:  [email or web address] ? ?Nadean Corwin Roselyn Doby ?01/02/2022, 10:28 AM ? ?

## 2022-01-02 NOTE — Consult Note (Signed)
? ?Cardiology Consult  ?  ?Patient ID: Nancy Marsh ?MRN: [id number], DOB/AGE: 04-27-1956  ? ?Admit date: 01/01/2022 ?Date of Consult: 01/02/2022 ?Requesting Provider: Etta Quill, DO ? ?PCP:  Nicholes Rough, PA-C ?Cardiologist:  none ? ?Patient Profile  ?  ?Nancy Marsh is a 66 y.o. female with a history of HTN, COPD, dyslipidemia, and LBBB who presented tonight with shortness of breath. She is being seen for the evaluation of suspected heart failure. ? ?History of Present Illness  ? ?Reports feeling more short of breath, worse with exertion yesterday while walking around a family member's house. Drover herself after this and continued to feel significantly short of breath. She tried using her MDI a few times but this did not help at all as it usually does. Nonproductive cough as well. She states she also had a less severe episode about a week ago and has not returned to her baseline in the interim with persistent exertional dyspnea. She denies any chest pain or orthopnea. No leg swelling. Occasional may have palpitations that have been long standing and very vague in her description of these - unclear if just episodes of anxiety. No abdominal pain/swelling. Denies illicit drug use, any major stressful events recently. No prior cardiac work up and no family history of heart disease. Takes oxycodone for chronic back pain but has not been on Opana.  ? ?On arrival of first responders, she was reportedly tripoding and in respiratory distress. Albuterol nebs given and apparently had some response to this but still wheezing. CPAP placed and she was transported to the ED. Work-up notable for BNP 1864, troponin 57->115, transaminases in 100s with normal bilirubin, Cr 0.9, CBC normal. ECG shows known LBBB with ST-T wave changes consistent with repolarization abnormality. CXR with possibly very mild pulmonary edema. Blood gas obtained on room air after 1.5 hr off CPAP is normal (7.37/48). She was initially reported to  be wheezing and treated as COPD with duonebs and solumedrol with admission to medicine. However, now suspected to be heart failure and received Lasix 40mg  IV around midnight. At the time of my evaluation she was resting comfortably and in no distress without dyspnea at rest. Reports she has urinated quite a bit since receiving the Lasix.  ? ?Past Medical History  ? ?Past Medical History:  ?Diagnosis Date  ? COPD (chronic obstructive pulmonary disease) ([place])   ? Degenerative disc disease, lumbar   ? Hypertension   ? Neuropathy   ? Panic attack   ?  ?Past Surgical History:  ?Procedure Laterality Date  ? BACK SURGERY    ? CHOLECYSTECTOMY    ?  ? ?Allergies  ?Allergen Reactions  ? Ibuprofen Anaphylaxis  ? Nsaids Anaphylaxis  ? ?Inpatient Medications  ?  ? baclofen  10 mg Oral TID  ? DULoxetine  30 mg Oral Daily  ? enoxaparin (LOVENOX) injection  40 mg Subcutaneous QHS  ? fluticasone furoate-vilanterol  1 puff Inhalation Daily  ? And  ? umeclidinium bromide  1 puff Inhalation Daily  ? rosuvastatin  40 mg Oral Daily  ? sodium chloride flush  3 mL Intravenous Q12H  ? traZODone  50 mg Oral QHS  ? ? ?Family History  ?  ?No family history on file. ?has no family status information on file.  ? ? ?Social History  ?  ?Social History  ? ?Socioeconomic History  ? Marital status: Legally Separated  ?  Spouse name: Not on file  ? Number of children: Not on file  ?  Years of education: Not on file  ? Highest education level: Not on file  ?Occupational History  ? Not on file  ?Tobacco Use  ? Smoking status: Every Day  ?  Packs/day: 0.50  ?  Types: Cigarettes  ? Smokeless tobacco: Never  ?Substance and Sexual Activity  ? Alcohol use: No  ? Drug use: Not on file  ? Sexual activity: Not on file  ?Other Topics Concern  ? Not on file  ?Social History Narrative  ? Not on file  ? ?Social Determinants of Health  ? ?Financial Resource Strain: Not on file  ?Food Insecurity: Not on file  ?Transportation Needs: Not on file  ?Physical Activity: Not  on file  ?Stress: Not on file  ?Social Connections: Not on file  ?Intimate Partner Violence: Not on file  ?  ? ?Review of Systems  ?  ?A comprehensive review of systems was obtained with pertinent positives and negatives noted in the HPI. ? ?Physical Exam  ?  ?Blood pressure 137/86, pulse (!) 101, temperature 97.7 ?F (36.5 ?C), temperature source Oral, resp. rate (!) 24, height 5\' 4"  (1.626 m), weight 56.7 kg, SpO2 96 %.  ?  ?No intake or output data in the 24 hours ending 01/02/22 0030 ?Wt Readings from Last 3 Encounters:  ?01/01/22 56.7 kg  ?12/19/21 57.2 kg  ?01/02/20 56.1 kg  ? ? ?CONSTITUTIONAL: alert and conversant, well-appearing, nourished, no distress ?HEENT: normal ?NECK: no masses ?CARDIAC: Regular rhythm. Normal S1/S2, no S3/S4. No murmur. No friction rub. JVP difficult to quantify.  ?VASCULAR: Radial pulses intact bilaterally. No carotid bruits. ?PULMONARY/CHEST WALL: no deformities, normal breath sounds bilaterally, normal work of breathing ?ABDOMINAL: soft, non-tender, non-distended ?EXTREMITIES: no edema, no muscle atrophy, warm and well-perfused ?SKIN: Dry and intact without apparent rashes or wounds. No peripheral cyanosis. ?NEUROLOGIC: alert, no abnormal movements, cranial nerves grossly intact. ?PSYCH: normal affect, normal speech and language ? ? ?Labs  ?  ?Recent Labs  ?  01/01/22 ?1942 01/01/22 ?2126  ?TROPONINIHS 57* 102*  ? ?Lab Results  ?Component Value Date  ? WBC 6.6 01/01/2022  ? HGB 15.3 (H) 01/01/2022  ? HCT 45.0 01/01/2022  ? MCV 89.8 01/01/2022  ? PLT 171 01/01/2022  ?  ?Recent Labs  ?Lab 01/01/22 ?1942 01/01/22 ?2046  ?NA 140 137  ?K 3.5 3.8  ?CL 109  --   ?CO2 23  --   ?BUN 14  --   ?CREATININE 0.90  --   ?CALCIUM 8.8*  --   ?PROT 6.3*  --   ?BILITOT 0.4  --   ?ALKPHOS 95  --   ?ALT 138*  --   ?AST 105*  --   ?GLUCOSE 134*  --   ? ?No results found for: CHOL, HDL, LDLCALC, TRIG ?Lab Results  ?Component Value Date  ? DDIMER 1.63 (H) 01/01/2022  ? ?Recent Labs  ?  01/01/22 ?1942   ?BNP 1,863.9*  ? ? ?  ?Radiology Studies  ?  ?DG Chest 2 View ? ?Result Date: 12/19/2021 ?CLINICAL DATA:  cough EXAM: CHEST - 2 VIEW COMPARISON:  None. FINDINGS: No consolidation. No visible pleural effusions or pneumothorax. Cardiomediastinal silhouette is mildly enlarged. No evidence of acute osseous abnormality. IMPRESSION: 1. No evidence of acute cardiopulmonary disease. 2. Mild cardiomegaly. Electronically Signed   By: Feliberto Harts M.D.   On: 12/19/2021 09:56  ? ?DG Chest Portable 1 View ? ?Result Date: 01/01/2022 ?CLINICAL DATA:  Shortness of breath EXAM: PORTABLE CHEST 1 VIEW COMPARISON:  Chest  x-ray dated December 19, 2021 FINDINGS: Unchanged cardiomegaly. Mild bilateral interstitial opacities. No focal consolidation. No large pleural effusion or pneumothorax. IMPRESSION: 1. Mild bilateral interstitial opacities, concerning for pulmonary edema. 2. Unchanged cardiomegaly. Electronically Signed   By: Yetta Glassman M.D.   On: 01/01/2022 20:06   ? ?ECG & Cardiac Imaging  ?  ?ECG: sinus rhythm, LBBB - personally reviewed. ? ?Assessment & Plan  ?  ?Respiratory distress.  ?She improved very rapidly and seemed to have had some bronchodilator response. On my exam her lungs are now clear and her JVP is difficult to appreciate. She has no real specific symptoms for heart failure (vs COPD). However, her BNP was significantly elevated and she also received furosemide. Her blood pressure on scene was 160s/90s which is likely due to distress. She has no murmur or S3. At this point, I think an echo would be a very helpful next step in her evaluation. I don't think she will require any additional diuretic tonight. Would wait for the echo result to determine the most appropriate medical therapy. The troponin elevation is consistent with minor type 2 injury, would not treat as ACS at this time. Obtain lipid profile and A1c for risk stratification.  ? ? ?Signed, ?Marykay Lex, MD ?01/02/2022, 12:30 AM ? ?For questions or  updates, please contact   ?Please consult [email or web address] for contact info under Cardiology/STEMI. ? ? ?

## 2022-01-02 NOTE — Progress Notes (Signed)
? ?  Fellow note from this morning reviewed- agree with findings. Concern for CHF with mild troponin elevation. Echo performed today - I personally reviewed, this demonstrates global hypokinesis with LVEF around 20-25%. Good response to diuretics reported, but only net negative 300 ml on the chart. Weight is down 1 kg.  I discussed the echo findings today with the patient - my recommendation is for [facility] today. She is in agreement with this. Keep NPO.  ? ?CARDIAC CATHETERIZATION CONSENT ? ?The procedure with Risks/Benefits/Alternatives and Indications was reviewed with the patient. All questions were answered.   ? ?Risks / Complications include, but not limited to: Death, MI, CVA/TIA, VF/VT (with defibrillation), Bradycardia (need for temporary pacer placement), contrast induced nephropathy, bleeding / bruising / hematoma / pseudoaneurysm, vascular or coronary injury (with possible emergent CT or Vascular Surgery), adverse medication reactions, infection.   ? ?The patient voiced understanding and agreed to proceed.  I have signed the consent form and placed it on the chart for patient signature and RN witness.   ? ?Pixie Casino, MD, [facility], FACP  ?[place]  ?Medical Director of the Advanced Lipid Disorders &  ?Cardiovascular Risk Reduction Clinic ?Diplomate of the [employer] of Clinical Lipidology ?Attending Cardiologist  ?Direct Dial: [phone]  Fax: [phone]  ?Website:  [email or web address] ? ?Nadean Corwin Tionna Gigante ?01/02/2022, 10:28 AM ? ?

## 2022-01-02 NOTE — Progress Notes (Signed)
Heart Failure Navigator Progress Note ? ?Following this hospitalization to assess for HV TOC readiness.  ? ?Echo 20-25 % ? ?Left/ Right Heart Cath 01/02/2022 ? ?Rhae Hammock, BSN, RN ?Heart Failure Nurse Navigator ?Secure Chat Only  ?

## 2022-01-02 NOTE — Progress Notes (Signed)
OT Cancellation Note ? ?Patient Details ?Name: Nancy Marsh ?MRN: [id number] ?DOB: [birth year] ? ? ?Cancelled Treatment:    Reason Eval/Treat Not Completed: OT screened, no needs identified, will sign off Patient working with PT earlier this morning. PT contacting OT stating patient has no acute OT needs at this time. OT is signing off and completing the order. Please re-consult OT if further needs arise.  ? ?Nancy Marsh, OTR/L ?Acute Rehabilitation Services ?[phone] ?[phone]  ? ?Nancy Marsh ?01/02/2022, 8:46 AM ?

## 2022-01-02 NOTE — Progress Notes (Signed)
?PROGRESS NOTE ? ? ?Nancy Marsh [place]  [id number] DOB: 1956-07-22 DOA: 01/01/2022 ?PCP: Nicholes Rough, PA-C  ?Brief Narrative:  ?66 year old community dwelling black female ?Active smoker (50-pack-year) with underlying COPD followed by Dr. Vaughan Browner of [place] on Advair, albuterol-FEV1/FVC 64%, emphysema ?Prior L4-S1 fusion with continued pain--on Cymbalta, oxycodone ?Patient apparently got sick mid March took Mucinex has continued to have a cough ?Came to emergency room 4/14, treated for COPD exacerbation refills were given patient was given steroid and went home ? ?Represented with SOB all day with diaphoresis given Atrovent albuterol heart rate noted to be 130--- patient started on Solu-Medrol DuoNeb's Lasix and other inhalers during ED visit ? ?EKG showed new LBBB and LVH troponins in the low 100s echo performed cardiology consulted EF noted to be in the 20% range ? ? ? 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 ?DVT prophylaxis: Lovenox ?Code Status: Full ?Family Communication: None present ?Disposition:  ?Status is: Inpatient ?Remains inpatient appropriate because:  ? ?Not cleared by cardiology yet for discharge-apparently does get diuresing today ?  ?Consultants:  ?Cardiology ? ?Procedures: Cardiac cath 4/28 ? ?Antimicrobials: None ? ? ?Subjective: ?Coherent alert no distress when I saw her she was still awaiting cath ? ?Objective: ?Vitals:  ? 01/01/22 2300 01/02/22 0003  01/02/22 0035 01/02/22 0440  ?BP: 132/80 137/86 113/83 114/66  ?Pulse:  (!) 101 73 90  ?Resp: (!) 23 (!) 24 19 17   ?Temp:   98.8 ?F (37.1 ?C) 98.8 ?F (37.1 ?C)  ?TempSrc:   Oral Oral  ?SpO2:  96% 95% 94%  ?Weight:   55.3 kg 55.3 kg  ?Height:   5\' 4"  (1.626 m)   ? ? ?Intake/Output Summary (Last 24 hours) at 01/02/2022 1258 ?Last data filed at 01/02/2022 0300 ?Gross per 24 hour  ?Intake --  ?Output 300 ml  ?Net -300 ml  ? ?Filed Weights  ? 01/01/22 1924 01/02/22 0035 01/02/22 0440  ?Weight: 56.7 kg 55.3 kg 55.3 kg  ? ? ?Examination: ? ?Looks younger than stated age no icterus no pallor neck soft supple ?S1-S2 audible ?Sinus rhythm on telemetry ?Abdomen is soft no rebound ?No lower extremity edema ? ? ?Data Reviewed: personally reviewed  ? ?CBC ?   ?Component Value Date/Time  ? WBC 6.6 01/01/2022 1942  ? RBC 5.19 (H) 01/01/2022 1942  ? HGB 15.3 (H) 01/01/2022 2046  ? HCT 45.0 01/01/2022 2046  ? PLT 171 01/01/2022 1942  ? MCV 89.8 01/01/2022 1942  ? MCH 27.7 01/01/2022 1942  ? MCHC 30.9 01/01/2022 1942  ? RDW 14.4 01/01/2022 1942  ? LYMPHSABS 2.9 01/01/2022 1942  ? MONOABS 0.4 01/01/2022 1942  ? EOSABS 0.1 01/01/2022 1942  ? BASOSABS 0.0 01/01/2022 1942  ? ? ?  Latest Ref Rng & Units 01/02/2022  ?  3:10 AM 01/01/2022  ?  8:46 PM 01/01/2022  ?  7:42 PM  ?CMP  ?Glucose 70 - 99 mg/dL 193    134    ?BUN 8 - 23 mg/dL 13  14    ?Creatinine 0.44 - 1.00 mg/dL 0.89    0.90    ?Sodium 135 - 145 mmol/L 139   137   140    ?Potassium 3.5 - 5.1 mmol/L 3.6   3.8   3.5    ?Chloride 98 - 111 mmol/L 106    109    ?CO2 22 - 32 mmol/L 24    23    ?Calcium 8.9 - 10.3 mg/dL 8.8    8.8    ?Total Protein 6.5 - 8.1 g/dL 6.4    6.3    ?Total Bilirubin 0.3 - 1.2 mg/dL 0.6    0.4    ?Alkaline Phos 38 - 126 U/L 92    95    ?AST 15 - 41 U/L 75    105    ?ALT 0 - 44 U/L 135    138    ? ? ? ?Radiology Studies: ?DG Chest Portable 1 View ? ?Result Date: 01/01/2022 ?CLINICAL DATA:  Shortness of breath EXAM: PORTABLE CHEST 1 VIEW COMPARISON:  Chest x-ray  dated December 19, 2021 FINDINGS: Unchanged cardiomegaly. Mild bilateral interstitial opacities. No focal consolidation. No large pleural effusion or pneumothorax. IMPRESSION: 1. Mild bilateral interstitial opacities, concerning for pulmonary edema. 2. Unchanged cardiomegaly. Electronically Signed   By: Yetta Glassman M.D.   On: 01/01/2022 20:06  ? ?ECHOCARDIOGRAM COMPLETE ? ?Result Date: 01/02/2022 ?   ECHOCARDIOGRAM REPORT   Patient Name:   Nancy Marsh Date of Exam: 01/02/2022 Medical Rec #:  [id number]        Height:       64.0 in Accession #:    [id number]       Weight:       122.0 lb Date of Birth:  01/07/1956        BSA:          1.586 m? Patient Age:    66 years         BP:           114/66 mmHg Patient Gender: F                HR:           87 bpm. Exam Location:  Inpatient Procedure: 2D Echo, Cardiac Doppler, Color Doppler and Intracardiac            Opacification Agent Indications:    CHF  History:        Patient has no prior history of Echocardiogram examinations.                 COPD, Arrythmias:LBBB; Risk Factors:Hypertension.  Sonographer:    Jyl Heinz Referring Phys: [place]  1. Left ventricular ejection fraction, by estimation, is 20 to 25%. The left ventricle has severely decreased function. The left ventricle demonstrates global hypokinesis. There is mild left ventricular hypertrophy. Left ventricular diastolic parameters  are consistent with Grade I diastolic dysfunction (impaired relaxation).  2. Right ventricular systolic function is normal. The right ventricular size is normal.  3. The mitral valve is normal in structure. Mild mitral valve regurgitation. No evidence of mitral stenosis.  4. The aortic valve is tricuspid. There is mild calcification of the aortic valve. There is mild thickening of the aortic valve. Aortic valve regurgitation is not visualized. Aortic valve sclerosis/calcification is present, without any evidence of aortic stenosis.  5. The inferior  vena cava is normal in size with greater than 50% respiratory variability, suggesting right atrial  pressure of 3 mmHg. FINDINGS  Left Ventricle: Left ventricular ejection fraction, by estimation, is 20 to 25%. The left ventricle has severely decreased function. The left ventricle demonstrates global hypokinesis. Definity contrast agent was given IV to delineate the left ventricular endocardial borders. The left ventricular internal cavity size was normal in size. There is mild left ventricular hypertrophy. Left ventricular diastolic parameters are consistent with Grade I diastolic dysfunction (impaired relaxation). Right Ventricle: The right ventricular size is normal. No increase in right ventricular wall thickness. Right ventricular systolic function is normal. Left Atrium: Left atrial size was normal in size. Right Atrium: Right atrial size was normal in size. Pericardium: There is no evidence of pericardial effusion. Mitral Valve: The mitral valve is normal in structure. Mild mitral valve regurgitation. No evidence of mitral valve stenosis. Tricuspid Valve: The tricuspid valve is normal in structure. Tricuspid valve regurgitation is trivial. No evidence of tricuspid stenosis. Aortic Valve: The aortic valve is tricuspid. There is mild calcification of the aortic valve. There is mild thickening of the aortic valve. Aortic valve regurgitation is not visualized. Aortic valve sclerosis/calcification is present, without any evidence of aortic stenosis. Aortic valve peak gradient measures 9.1 mmHg. Pulmonic Valve: The pulmonic valve was normal in structure. Pulmonic valve regurgitation is not visualized. No evidence of pulmonic stenosis. Aorta: The aortic root is normal in size and structure. Venous: The inferior vena cava is normal in size with greater than 50% respiratory variability, suggesting right atrial pressure of 3 mmHg. IAS/Shunts: No atrial level shunt detected by color flow Doppler.  LEFT VENTRICLE PLAX 2D  LVIDd:         5.60 cm      Diastology LVIDs:         4.90 cm      LV e' medial:    6.42 cm/s LV PW:         1.40 cm      LV E/e' medial:  7.2 LV IVS:        1.30 cm      LV e' lateral:   6.74 cm/s LVOT diam:

## 2022-01-02 NOTE — Progress Notes (Addendum)
? ?  Cath films reviewed and discussed with Dr. 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 Will start GDMT for heart failure - BP elevated, would not start home amlodipine - instead, start Entresto 24/26 mg BID and Jardiance 10 mg daily tomorrow. Continue statin - hold on BB for now given low output. She has anaphylaxis to NSAIDS, so I would not recommend aspirin. Will give one additional dose of lasix today.  Will probably need TOC heart failure and pharmacy assistance.  ? ?Chrystie Nose, MD, [facility], FACP  ?[place]  [facility]  ?Medical Director of the [facility] &  ?[facility] ?Diplomate of the [employer] of Clinical Lipidology ?Attending Cardiologist  ?Direct Dial: [phone]  Fax: [phone]  ?Website:  [email or web address] ? ?

## 2022-01-03 ENCOUNTER — Encounter (HOSPITAL_COMMUNITY): Payer: Self-pay | Admitting: Internal Medicine

## 2022-01-03 ENCOUNTER — Other Ambulatory Visit: Payer: Self-pay

## 2022-01-03 LAB — CBC
HCT: 42.3 % (ref 36.0–46.0)
Hemoglobin: 14.1 g/dL (ref 12.0–15.0)
MCH: 28.1 pg (ref 26.0–34.0)
MCHC: 33.3 g/dL (ref 30.0–36.0)
MCV: 84.3 fL (ref 80.0–100.0)
Platelets: 199 10*3/uL (ref 150–400)
RBC: 5.02 MIL/uL (ref 3.87–5.11)
RDW: 14.5 % (ref 11.5–15.5)
WBC: 9.5 10*3/uL (ref 4.0–10.5)
nRBC: 0 % (ref 0.0–0.2)

## 2022-01-03 LAB — COMPREHENSIVE METABOLIC PANEL
ALT: 128 U/L — ABNORMAL HIGH (ref 0–44)
AST: 56 U/L — ABNORMAL HIGH (ref 15–41)
Albumin: 3.3 g/dL — ABNORMAL LOW (ref 3.5–5.0)
Alkaline Phosphatase: 84 U/L (ref 38–126)
Anion gap: 8 (ref 5–15)
BUN: 17 mg/dL (ref 8–23)
CO2: 27 mmol/L (ref 22–32)
Calcium: 9.2 mg/dL (ref 8.9–10.3)
Chloride: 104 mmol/L (ref 98–111)
Creatinine, Ser: 0.8 mg/dL (ref 0.44–1.00)
GFR, Estimated: >60 mL/min (ref >60)
Glucose, Bld: 90 mg/dL (ref 70–99)
Potassium: 3.9 mmol/L (ref 3.5–5.1)
Sodium: 139 mmol/L (ref 135–145)
Total Bilirubin: 0.6 mg/dL (ref 0.3–1.2)
Total Protein: 6.1 g/dL — ABNORMAL LOW (ref 6.5–8.1)

## 2022-01-03 LAB — MAGNESIUM: Magnesium: 2 mg/dL (ref 1.7–2.4)

## 2022-01-03 MED ORDER — SACUBITRIL-VALSARTAN 24-26 MG PO TABS: 1.0000 | ORAL_TABLET | Freq: Two times a day (BID) | ORAL | Status: DC

## 2022-01-03 MED ORDER — EMPAGLIFLOZIN 10 MG PO TABS: 10.0000 mg | ORAL_TABLET | Freq: Every day | ORAL | Status: DC

## 2022-01-03 MED ORDER — METOPROLOL SUCCINATE ER 25 MG PO TB24
12.5000 mg | ORAL_TABLET | Freq: Every day | ORAL | Status: DC
Start: 2022-01-03 — End: 2022-01-04
  Administered 2022-01-03 – 2022-01-04 (×2): 12.5 mg via ORAL
  Filled 2022-01-03 (×2): qty 1

## 2022-01-03 NOTE — Progress Notes (Signed)
? ?Progress Note ? ?Patient Name: Nancy Marsh ?Date of Encounter: 01/03/2022 ? ?[facility] Cardiologist: None  ? ?Subjective  ? ?Feeling better.  No chest pain.  Decrease shortness of breath. ? ?Inpatient Medications  ?  ?Scheduled Meds: ? baclofen  10 mg Oral TID  ? DULoxetine  30 mg Oral Daily  ? empagliflozin  10 mg Oral Daily  ? enoxaparin (LOVENOX) injection  40 mg Subcutaneous Q24H  ? feeding supplement  1 Container Oral TID BM  ? fluticasone furoate-vilanterol  1 puff Inhalation Daily  ? And  ? umeclidinium bromide  1 puff Inhalation Daily  ? multivitamin with minerals  1 tablet Oral Daily  ? nicotine  14 mg Transdermal Daily  ? rosuvastatin  40 mg Oral Daily  ? sacubitril-valsartan  1 tablet Oral BID  ? traZODone  50 mg Oral QHS  ? ?Continuous Infusions: ? ?PRN Meds: ?acetaminophen, levalbuterol, ondansetron (ZOFRAN) IV, oxyCODONE-acetaminophen  ? ?Vital Signs  ?  ?Vitals:  ? 01/02/22 1636 01/02/22 1934 01/03/22 0534 01/03/22 0858  ?BP: (!) 143/104 122/84 126/87   ?Pulse: (!) 108 92 87 91  ?Resp: 17 19 18    ?Temp:  98.5 ?F (36.9 ?C) 98.3 ?F (36.8 ?C) 98.3 ?F (36.8 ?C)  ?TempSrc:  Oral Oral Oral  ?SpO2:  96% 97%   ?Weight:   54.3 kg   ?Height:      ? ? ?Intake/Output Summary (Last 24 hours) at 01/03/2022 1003 ?Last data filed at 01/02/2022 2138 ?Gross per 24 hour  ?Intake 145.88 ml  ?Output 400 ml  ?Net -254.12 ml  ? ? ?  01/03/2022  ?  5:34 AM 01/02/2022  ?  4:40 AM 01/02/2022  ? 12:35 AM  ?Last 3 Weights  ?Weight (lbs) 119 lb 11.4 oz 122 lb 121 lb 14.4 oz  ?Weight (kg) 54.3 kg 55.339 kg 55.293 kg  ?   ? ?Telemetry  ?  ?Brief atrial tachycardia otherwise sinus rhythm/mild sinus tachycardia- Personally Reviewed ? ?ECG  ?  ?Sinus rhythm bundle branch block- Personally Reviewed ? ?Physical Exam  ? ?GEN: No acute distress.  Thin ?Neck: No JVD ?Cardiac: RRR, no murmurs, rubs, or gallops.  ?Respiratory: Clear to auscultation bilaterally. ?GI: Soft, nontender, non-distended  ?MS: No edema; No deformity. ?Neuro:   Nonfocal  ?Psych: Normal affect  ? ?Labs  ?  ?High Sensitivity Troponin:   ?Recent Labs  ?Lab 01/01/22 ?1942 01/01/22 ?2126  ?TROPONINIHS 57* 102*  ?   ?Chemistry ?Recent Labs  ?Lab 01/01/22 ?1942 01/01/22 ?2046 01/02/22 ?0310 01/02/22 ?1619 01/02/22 ?1625 01/02/22 ?1626 01/03/22 ?[id number]  ?NA 140   < > 139   < > 140 142 139  ?K 3.5   < > 3.6   < > 4.3 4.0 3.9  ?CL 109  --  106  --   --   --  104  ?CO2 23  --  24  --   --   --  27  ?GLUCOSE 134*  --  193*  --   --   --  90  ?BUN 14  --  13  --   --   --  17  ?CREATININE 0.90  --  0.89  --   --   --  0.80  ?CALCIUM 8.8*  --  8.8*  --   --   --  9.2  ?MG  --   --   --   --   --   --  2.0  ?PROT 6.3*  --  6.4*  --   --   --  6.1*  ?ALBUMIN 3.4*  --  3.4*  --   --   --  3.3*  ?AST 105*  --  75*  --   --   --  56*  ?ALT 138*  --  135*  --   --   --  128*  ?ALKPHOS 95  --  92  --   --   --  84  ?BILITOT 0.4  --  0.6  --   --   --  0.6  ?GFRNONAA >60  --  >60  --   --   --  >60  ?ANIONGAP 8  --  9  --   --   --  8  ? < > = values in this interval not displayed.  ?  ?Lipids  ?Recent Labs  ?Lab 01/02/22 ?0310  ?CHOL 248*  ?TRIG 38  ?HDL 73  ?LDLCALC 167*  ?CHOLHDL 3.4  ?  ?Hematology ?Recent Labs  ?Lab 01/01/22 ?1942 01/01/22 ?2046 01/02/22 ?1625 01/02/22 ?1626 01/03/22 ?0219  ?WBC 6.6  --   --   --  9.5  ?RBC 5.19*  --   --   --  5.02  ?HGB 14.4   < > 15.0 14.3 14.1  ?HCT 46.6*   < > 44.0 42.0 42.3  ?MCV 89.8  --   --   --  84.3  ?MCH 27.7  --   --   --  28.1  ?MCHC 30.9  --   --   --  33.3  ?RDW 14.4  --   --   --  14.5  ?PLT 171  --   --   --  199  ? < > = values in this interval not displayed.  ? ?Thyroid  ?Recent Labs  ?Lab 01/01/22 ?2343  ?TSH 0.642  ?  ?BNP ?Recent Labs  ?Lab 01/01/22 ?1942  ?BNP 1,863.9*  ?  ?DDimer  ?Recent Labs  ?Lab 01/01/22 ?2255  ?DDIMER 1.63*  ?  ? ?Radiology  ?  ?CARDIAC CATHETERIZATION ? ?Result Date: 01/02/2022 ?  Mid RCA lesion is 35% stenosed.   Ost LAD to Prox LAD lesion is 30% stenosed.   LV end diastolic pressure is mildly elevated.    Hemodynamic findings consistent with mild pulmonary hypertension. 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 PCWP mean 17 mm Hg. LVEDP 20 mm Hg Mild pulmonary HTN. PAP mean 30 mm Hg Reduced cardiac output. Co-ox 60 with Cardiac output 3.25 L/min. Index 2.06. Plan: medical therapy.  ? ?DG Chest Portable 1 View ? ?Result Date: 01/01/2022 ?CLINICAL DATA:  Shortness of breath EXAM: PORTABLE CHEST 1 VIEW COMPARISON:  Chest x-ray dated December 19, 2021 FINDINGS: Unchanged cardiomegaly. Mild bilateral interstitial opacities. No focal consolidation. No large pleural effusion or pneumothorax. IMPRESSION: 1. Mild bilateral interstitial opacities, concerning for pulmonary edema. 2. Unchanged cardiomegaly. Electronically Signed   By: Yetta Glassman M.D.   On: 01/01/2022 20:06  ? ?ECHOCARDIOGRAM COMPLETE ? ?Result Date: 01/02/2022 ?   ECHOCARDIOGRAM REPORT   Patient Name:   Nancy Marsh Date of Exam: 01/02/2022 Medical Rec #:  [id number]        Height:       64.0 in Accession #:    [id number]       Weight:       122.0 lb Date of Birth:  12/16/1955        BSA:  1.586 m? Patient Age:    66 years         BP:           114/66 mmHg Patient Gender: F                HR:           87 bpm. Exam Location:  Inpatient Procedure: 2D Echo, Cardiac Doppler, Color Doppler and Intracardiac            Opacification Agent Indications:    CHF  History:        Patient has no prior history of Echocardiogram examinations.                 COPD, Arrythmias:LBBB; Risk Factors:Hypertension.  Sonographer:    Jyl Heinz Referring Phys: [place]  1. Left ventricular ejection fraction, by estimation, is 20 to 25%. The left ventricle has severely decreased function. The left ventricle demonstrates global hypokinesis. There is mild left ventricular hypertrophy. Left ventricular diastolic parameters  are consistent with Grade I diastolic dysfunction  (impaired relaxation).  2. Right ventricular systolic function is normal. The right ventricular size is normal.  3. The mitral valve is normal in structure. Mild mitral valve regurgitation. No evidence of mitral stenosis.  4. The aortic valve is tricuspid. There is mild calcification of the aortic valve. There is mild thickening of the aortic valve. Aortic valve regurgitation is not visualized. Aortic valve sclerosis/calcification is present, without any evidence of aortic stenosis.  5. The inferior vena cava is normal in size with greater than 50% respiratory variability, suggesting right atrial pressure of 3 mmHg. FINDINGS  Left Ventricle: Left ventricular ejection fraction, by estimation, is 20 to 25%. The left ventricle has severely decreased function. The left ventricle demonstrates global hypokinesis. Definity contrast agent was given IV to delineate the left ventricular endocardial borders. The left ventricular internal cavity size was normal in size. There is mild left ventricular hypertrophy. Left ventricular diastolic parameters are consistent with Grade I diastolic dysfunction (impaired relaxation). Right Ventricle: The right ventricular size is normal. No increase in right ventricular wall thickness. Right ventricular systolic function is normal. Left Atrium: Left atrial size was normal in size. Right Atrium: Right atrial size was normal in size. Pericardium: There is no evidence of pericardial effusion. Mitral Valve: The mitral valve is normal in structure. Mild mitral valve regurgitation. No evidence of mitral valve stenosis. Tricuspid Valve: The tricuspid valve is normal in structure. Tricuspid valve regurgitation is trivial. No evidence of tricuspid stenosis. Aortic Valve: The aortic valve is tricuspid. There is mild calcification of the aortic valve. There is mild thickening of the aortic valve. Aortic valve regurgitation is not visualized. Aortic valve sclerosis/calcification is present, without  any evidence of aortic stenosis. Aortic valve peak gradient measures 9.1 mmHg. Pulmonic Valve: The pulmonic valve was normal in structure. Pulmonic valve regurgitation is not visualized. No evidence of pulmonic s

## 2022-01-03 NOTE — Progress Notes (Signed)
?PROGRESS NOTE ? ? ?[name]  YHC:[id number] DOB: 11-Oct-1955 DOA: 01/01/2022 ?PCP: Ladora Daniel, PA-C  ?Brief Narrative:  ?66 year old community dwelling black female ?Active smoker (50-pack-year) with underlying COPD followed by Dr. Isaiah Serge of [place] on Advair, albuterol-FEV1/FVC 64%, emphysema ?Prior L4-S1 fusion with continued pain--on Cymbalta, oxycodone ?Patient apparently got sick mid March took Mucinex has continued to have a cough ?Came to emergency room 4/14, treated for COPD exacerbation refills were given patient was given steroid and went home ? ?Represented with SOB all day with diaphoresis given Atrovent albuterol heart rate noted to be 130--- patient started on Solu-Medrol DuoNeb's Lasix and other inhalers during ED visit ? ?EKG showed new LBBB and LVH troponins in the low 100s echo performed  ? ?cardiology consulted EF noted to be in the 20% range ? ?Hospital-Problem based course ?New onset heart failure, NICM on Cardiac Cath done on 01/02/2022 ?Etiology unclear but not from coronary syndrome ?Rec'd lasix 40 last pm ?Meds /cardiology-Lasix 40 daily Entresto 1 tab twice daily Crestor 40  jardiance 10 qd ?Echo in 3 mo, ? 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 ?DVT prophylaxis: Lovenox ?Code Status: Full ?Family Communication: None present ?Disposition:  ?Status is: Inpatient ?Remains inpatient appropriate because:  ? ?Not cleared by cardiology yet for discharge-apparently diuresing today ?  ?Consultants:  ?Cardiology ? ?Procedures: Cardiac cath  4/28 ? ?Antimicrobials: None ? ? ?Subjective: ?Fair ?Wants reg diet [ordered] under condition she talk to nutritionist ? ?Objective: ?Vitals:  ? 01/02/22 1636 01/02/22 1934 01/03/22 0534 01/03/22 0858  ?BP: (!) 143/104 122/84 126/87   ?Pulse: (!) 108 92 87 91  ?Resp: 17 19 18    ?Temp:  98.5 ?F (36.9 ?C) 98.3 ?F (36.8 ?C) 98.3 ?F (36.8 ?C)  ?TempSrc:  Oral Oral Oral  ?SpO2:  96% 97%   ?Weight:   54.3 kg   ?Height:      ? ? ?Intake/Output Summary (Last 24 hours) at 01/03/2022 1226 ?Last data filed at 01/02/2022 2138 ?Gross per 24 hour  ?Intake 145.88 ml  ?Output 400 ml  ?Net -254.12 ml  ? ? ?Filed Weights  ? 01/02/22 0035 01/02/22 0440 01/03/22 0534  ?Weight: 55.3 kg 55.3 kg 54.3 kg  ? ? ?Examination: ? ?Looks younger than stated age no icterus no pallor neck soft supple ?S1-S2 audible ?Sinus rhythm on telemetry ?Abdomen is soft no rebound ?No lower extremity edema ?Neuro intact ? ?Data Reviewed: personally reviewed  ? ?CBC ?   ?Component Value Date/Time  ? WBC 9.5 01/03/2022 0219  ? RBC 5.02 01/03/2022 0219  ? HGB 14.1 01/03/2022 0219  ? HCT 42.3 01/03/2022 0219  ? PLT 199 01/03/2022 0219  ? MCV 84.3 01/03/2022 0219  ? MCH 28.1 01/03/2022 0219  ? MCHC 33.3 01/03/2022 0219  ? RDW 14.5 01/03/2022 0219  ? LYMPHSABS 2.9 01/01/2022 1942  ? MONOABS 0.4 01/01/2022 1942  ? EOSABS 0.1 01/01/2022 1942  ? BASOSABS 0.0 01/01/2022 1942  ? ? ?  Latest Ref Rng & Units 01/03/2022  ?  2:19 AM 01/02/2022  ?  4:26 PM 01/02/2022  ?  4:25 PM  ?CMP  ?Glucose 70 - 99 mg/dL 90      ?BUN 8 - 23 mg/dL 17      ?Creatinine 0.44 - 1.00 mg/dL 0.80      ?Sodium 135 - 145 mmol/L 139   142   140    ?Potassium 3.5 - 5.1 mmol/L 3.9   4.0   4.3    ?Chloride 98 - 111 mmol/L 104      ?CO2 22 - 32 mmol/L 27      ?Calcium 8.9 - 10.3 mg/dL 9.2      ?Total Protein 6.5 - 8.1 g/dL 6.1      ?Total Bilirubin 0.3 - 1.2 mg/dL 0.6      ?Alkaline Phos 38 - 126 U/L 84      ?AST 15 - 41 U/L 56      ?ALT 0 - 44 U/L 128      ? ? ? ?Radiology Studies: ?CARDIAC  CATHETERIZATION ? ?Result Date: 01/02/2022 ?  Mid RCA lesion is 35% stenosed.   Ost LAD to Prox LAD lesion is 30% stenosed.   LV end diastolic pressure is mildly elevated.   Hemodynamic findings consistent with mild pulmonary hypertension. 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 PCWP mean 17 mm Hg. LVEDP 20 mm Hg Mild pulmonary HTN. PAP mean 30 mm Hg Reduced cardiac output. Co-ox 60 with Cardiac output 3.25 L/min. Index 2.06. Plan: medical therapy.  ? ?DG Chest Portable 1 View ? ?Result Date: 01/01/2022 ?CLINICAL DATA:  Shortness of breath EXAM: PORTABLE CHEST 1 VIEW COMPARISON:  Chest x-ray dated December 19, 2021 FINDINGS: Unchanged cardiomegaly. Mild bilateral interstitial opacities. No focal consolidation. No large pleural effusion or pneumothorax. IMPRESSION: 1. Mild bilateral interstitial opacities, concerning for pulmonary edema. 2. Unchanged cardiomegaly. Electronically Signed   By: Yetta Glassman M.D.   On: 01/01/2022 20:06  ? ?ECHOCARDIOGRAM COMPLETE ? ?Result Date: 01/02/2022 ?   ECHOCARDIOGRAM REPORT   Patient Name:   Nancy Marsh Date of Exam: 01/02/2022 Medical Rec #:  [id number]        Height:       64.0 in Accession #:    [id number]       Weight:       122.0 lb Date of Birth:  04-25-1956        BSA:          1.586 m? Patient Age:    74 years         BP:           114/66 mmHg Patient Gender: F                HR:           87 bpm. Exam Location:  Inpatient Procedure: 2D Echo, Cardiac Doppler, Color Doppler and Intracardiac            Opacification Agent Indications:    CHF  History:        Patient has no prior history of Echocardiogram examinations.                 COPD, Arrythmias:LBBB; Risk Factors:Hypertension.  Sonographer:    Jyl Heinz Referring Phys: [place]  1. Left ventricular ejection fraction, by estimation, is 20 to 25%. The left ventricle has severely decreased function. The left ventricle  demonstrates global hypokinesis. There is mild left ventricular hypertrophy. Left ventricular diastolic parameters  are consistent  with Grade I diastolic dysfunction (impaired relaxation).  2. Right ventricular systolic function is normal. The right ventricular size is normal.  3. The mitral valve is normal in structure. Mild mitral valve regurgitation. No evidence of mitral stenosis.  4. The aortic valve is tricuspid. There is mild calcification of the aortic valve. There is mild thickening of the aortic valve. Aortic valve regurgitation is not visualized. Aortic valve sclerosis/calcification is present, without any evidence of aortic stenosis.  5. The inferior vena cava is normal in size with greater than 50% respiratory variability, suggesting right atrial pressure of 3 mmHg. FINDINGS  Left Ventricle: Left ventricular ejection fraction, by estimation, is 20 to 25%. The left ventricle has severely decreased function. The left ventricle demonstrates global hypokinesis. Definity contrast agent was given IV to delineate the left ventricular endocardial borders. The left ventricular internal cavity size was normal in size. There is mild left ventricular hypertrophy. Left ventricular diastolic parameters are consistent with Grade I diastolic dysfunction (impaired relaxation). Right Ventricle: The right ventricular size is normal. No increase in right ventricular wall thickness. Right ventricular systolic function is normal. Left Atrium: Left atrial size was normal in size. Right Atrium: Right atrial size was normal in size. Pericardium: There is no evidence of pericardial effusion. Mitral Valve: The mitral valve is normal in structure. Mild mitral valve regurgitation. No evidence of mitral valve stenosis. Tricuspid Valve: The tricuspid valve is normal in structure. Tricuspid valve regurgitation is trivial. No evidence of tricuspid stenosis. Aortic Valve: The aortic valve is tricuspid. There is mild calcification of the  aortic valve. There is mild thickening of the aortic valve. Aortic valve regurgitation is not visualized. Aortic valve sclerosis/calcification is present, without any evidence of aortic stenosis. Aortic valve peak gradient measures

## 2022-01-03 NOTE — Progress Notes (Signed)
TR BAND REMOVAL ? ?LOCATION:    right radial ? ?DEFLATED PER PROTOCOL:    Yes.   ? ?TIME BAND OFF / DRESSING APPLIED:    1945  ? ?SITE UPON ARRIVAL:    Level 0 ? ?SITE AFTER BAND REMOVAL:    Level 0 ? ?CIRCULATION SENSATION AND MOVEMENT:    Within Normal Limits   Yes.   ? ?COMMENTS:   Sterile dressing applied and good capillary refill. ?

## 2022-01-03 NOTE — Plan of Care (Signed)
?  Problem: Education: ?Goal: Knowledge of General Education information will improve ?Description: Including pain rating scale, medication(s)/side effects and non-pharmacologic comfort measures ?Outcome: Progressing ?  ?Problem: Clinical Measurements: ?Goal: Ability to maintain clinical measurements within normal limits will improve ?Outcome: Progressing ?  ?Problem: Clinical Measurements: ?Goal: Cardiovascular complication will be avoided ?Outcome: Progressing ?  ?Problem: Pain Managment: ?Goal: General experience of comfort will improve ?Outcome: Progressing ?  ?Problem: Cardiac: ?Goal: Ability to achieve and maintain adequate cardiopulmonary perfusion will improve ?Outcome: Progressing ?  ?

## 2022-01-04 LAB — CBC
HCT: 46.2 % — ABNORMAL HIGH (ref 36.0–46.0)
Hemoglobin: 15.1 g/dL — ABNORMAL HIGH (ref 12.0–15.0)
MCH: 27.9 pg (ref 26.0–34.0)
MCHC: 32.7 g/dL (ref 30.0–36.0)
MCV: 85.4 fL (ref 80.0–100.0)
Platelets: 185 10*3/uL (ref 150–400)
RBC: 5.41 MIL/uL — ABNORMAL HIGH (ref 3.87–5.11)
RDW: 14.4 % (ref 11.5–15.5)
WBC: 6.1 10*3/uL (ref 4.0–10.5)
nRBC: 0 % (ref 0.0–0.2)

## 2022-01-04 LAB — COMPREHENSIVE METABOLIC PANEL
ALT: 91 U/L — ABNORMAL HIGH (ref 0–44)
AST: 26 U/L (ref 15–41)
Albumin: 3 g/dL — ABNORMAL LOW (ref 3.5–5.0)
Alkaline Phosphatase: 73 U/L (ref 38–126)
Anion gap: 8 (ref 5–15)
BUN: 21 mg/dL (ref 8–23)
CO2: 23 mmol/L (ref 22–32)
Calcium: 8.7 mg/dL — ABNORMAL LOW (ref 8.9–10.3)
Chloride: 107 mmol/L (ref 98–111)
Creatinine, Ser: 0.79 mg/dL (ref 0.44–1.00)
GFR, Estimated: >60 mL/min (ref >60)
Glucose, Bld: 132 mg/dL — ABNORMAL HIGH (ref 70–99)
Potassium: 3.7 mmol/L (ref 3.5–5.1)
Sodium: 138 mmol/L (ref 135–145)
Total Bilirubin: 0.6 mg/dL (ref 0.3–1.2)
Total Protein: 5.6 g/dL — ABNORMAL LOW (ref 6.5–8.1)

## 2022-01-04 LAB — HEPATITIS PANEL, ACUTE
HCV Ab: NONREACTIVE
Hep A IgM: NONREACTIVE
Hep B C IgM: NONREACTIVE
Hepatitis B Surface Ag: NONREACTIVE

## 2022-01-04 LAB — MAGNESIUM: Magnesium: 1.9 mg/dL (ref 1.7–2.4)

## 2022-01-04 MED ORDER — EMPAGLIFLOZIN 10 MG PO TABS: 10.0000 mg | ORAL_TABLET | Freq: Every day | ORAL | 1 refills | Status: DC

## 2022-01-04 MED ORDER — SACUBITRIL-VALSARTAN 24-26 MG PO TABS: 1.0000 | ORAL_TABLET | Freq: Two times a day (BID) | ORAL | 1 refills | Status: DC

## 2022-01-04 MED ORDER — NICOTINE 14 MG/24HR TD PT24
14.0000 mg | MEDICATED_PATCH | Freq: Every day | TRANSDERMAL | 0 refills | Status: AC
Start: 2022-01-05 — End: ?

## 2022-01-04 MED ORDER — METOPROLOL SUCCINATE ER 25 MG PO TB24: 12.5000 mg | ORAL_TABLET | Freq: Every day | ORAL | 1 refills | Status: DC

## 2022-01-04 NOTE — Discharge Summary (Signed)
Physician Discharge Summary  ?[place] URK:[id number] DOB: 08/12/1956 DOA: 64/27/2023 ? ?PCP: Ladora Daniel, PA-C ? ?Admit date: 01/01/2022 ?Discharge date: 01/04/2022 ? ?Time spent: 36 minutes ? ?Recommendations for Outpatient Follow-up:  ?Rec TOC visit in OP setting ?Needs CMET/MAG and CBC ~ 1 week ?Trish CC on d/c--need RPt Echo 54mo--?PPM for Isch CM if still low ? ?Discharge Diagnoses:  ?MAIN problem for hospitalization  ? ?Acute HF new onset ? ?Please see below for itemized issues addressed in HOpsital- ?refer to other progress notes for clarity if needed ? ?Discharge Condition: improved ? ?Diet recommendation: hh low salt ? ?Filed Weights  ? 01/02/22 0440 01/03/22 0534 01/04/22 0500  ?Weight: 55.3 kg 54.3 kg 54.5 kg  ? ? ?History of present illness:  ?66 year old community dwelling black female ?Active smoker (50-pack-year) with underlying COPD followed by Dr. Isaiah Serge of  on Advair, albuterol-FEV1/FVC 64%, emphysema ?Prior L4-S1 fusion with continued pain--on Cymbalta, oxycodone ?Patient apparently got sick mid March took Mucinex has continued to have a cough ?Came to emergency room 4/14, treated for COPD exacerbation refills were given patient was given steroid and went home ?  ?Represented with SOB all day with diaphoresis given Atrovent albuterol heart rate noted to be 130--- patient started on Solu-Medrol DuoNeb's Lasix and other inhalers during ED visit ?  ?EKG showed new LBBB and LVH troponins in the low 100s  ?echo performed   ?cardiology consulted EF noted to be in the 20% range ? ?Hospital Course:  ?New onset heart failure, NICM on Cardiac Cath done on 01/02/2022 ?Etiology unclear but not from coronary syndrome ?Meds /cardiology-Lasix 40 daily Entresto 1 tab twice daily Crestor 40  jardiance 10 qd ?Echo in 3 mo, ? 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 ?Procedures: ?Cath 4/28 ? ?Consultations: ?cards ? ?Discharge Exam: ?Vitals:  ? 01/04/22 0531 01/04/22 0830  ?BP: 93/67 118/89  ?Pulse: 86 93  ?Resp: 20   ?Temp: 97.8 ?F (36.6 ?C)   ?SpO2: 98%   ? ? ?Subj on day of d/c ?  ?Awake coherent in nad no focal  ?Ambulatory no sob ? ?General Exam on discharge ? ?Eomi ncat no focal deficit ?Cta b no adde dsound no rales ?No wheeze ?Abd soft nt nd no rebound ?Neuro intact ?Power 5/5 ? ?Discharge Instructions ? ? ?Discharge Instructions   ? ? Call MD for:  extreme fatigue   Complete by: As directed ?  ? Diet - low sodium heart healthy   Complete by: As directed ?  ? Discharge instructions   Complete by: As directed ?  ? Please try to adhere to low-salt diet-please look at the labels of your food and stick with less than 2 g of salt daily ?You will be placed on numerous new medications in the outpatient setting and you will need to get follow-up with cardiology in about 1 to 2 weeks-it would be a good idea to get a weighing scale as well as daily weights-please see instructions below as well-I would recommend that you also follow-up in the outpatient setting for smoking cessation etc. etc.  ? Increase activity slowly   Complete by: As directed ?  ? ?  ? ?  Allergies as of 01/04/2022   ? ?   Reactions  ? Ibuprofen Anaphylaxis  ? Nsaids Anaphylaxis  ? ?  ? ?  ?Medication List  ?  ? ?STOP taking these medications   ? ?amLODipine 5 MG tablet ?Commonly known as: NORVASC ?  ?oxyCODONE-acetaminophen 5-325 MG tablet ?Commonly known as: PERCOCET/ROXICET ?  ? ?  ? ?TAKE these medications   ? ?albuterol 108 (90 Base) MCG/ACT inhaler ?Commonly known as: VENTOLIN HFA ?Inhale 1 puff into the lungs every 6 (six) hours as needed for wheezing or shortness of breath. ?  ?baclofen 10  MG tablet ?Commonly known as: LIORESAL ?Take 1 tablet (10 mg total) by mouth 3 (three) times daily. ?  ?DULoxetine 30 MG capsule ?Commonly known as: CYMBALTA ?Take 1 capsule (30 mg total) by mouth daily. ?  ?empagliflozin 10 MG Tabs tablet ?Commonly known as: JARDIANCE ?Take 1 tablet (10 mg total) by mouth daily. ?Start taking on: Jan 05, 2022 ?  ?Fluticasone-Umeclidin-Vilant 200-62.5-25 MCG/ACT Aepb ?Commonly known as: Trelegy Ellipta ?Inhale 1 puff into the lungs daily. ?  ?metoprolol succinate 25 MG 24 hr tablet ?Commonly known as: TOPROL-XL ?Take 0.5 tablets (12.5 mg total) by mouth daily. ?Start taking on: Jan 05, 2022 ?  ?nicotine 14 mg/24hr patch ?Commonly known as: NICODERM CQ - dosed in mg/24 hours ?Place 1 patch (14 mg total) onto the skin daily. ?Start taking on: Jan 05, 2022 ?  ?rosuvastatin 40 MG tablet ?Commonly known as: CRESTOR ?Take 40 mg by mouth daily. ?  ?sacubitril-valsartan 24-26 MG ?Commonly known as: ENTRESTO ?Take 1 tablet by mouth 2 (two) times daily. ?  ?traZODone 50 MG tablet ?Commonly known as: DESYREL ?Take 50 mg by mouth at bedtime. ?  ?Vitamin D (Ergocalciferol) 1.25 MG (50000 UNIT) Caps capsule ?Commonly known as: DRISDOL ?Take 50,000 Units by mouth every 7 (seven) days. ?  ? ?  ? ?Allergies  ?Allergen Reactions  ? Ibuprofen Anaphylaxis  ? Nsaids Anaphylaxis  ? ? ? ? ?The results of significant diagnostics from this hospitalization (including imaging, microbiology, ancillary and laboratory) are listed below for reference.   ? ?Significant Diagnostic Studies: ?DG Chest 2 View ? ?Result Date: 12/19/2021 ?CLINICAL DATA:  cough EXAM: CHEST - 2 VIEW COMPARISON:  None. FINDINGS: No consolidation. No visible pleural effusions or pneumothorax. Cardiomediastinal silhouette is mildly enlarged. No evidence of acute osseous abnormality. IMPRESSION: 1. No evidence of acute cardiopulmonary disease. 2. Mild cardiomegaly. Electronically Signed   By: Feliberto Harts M.D.   On: 12/19/2021 09:56   ? ?CARDIAC CATHETERIZATION ? ?Result Date: 01/02/2022 ?  Mid RCA lesion is 35% stenosed.   Ost LAD to Prox LAD lesion is 30% stenosed.   LV end diastolic pressure is mildly elevated.   Hemodynamic findings consistent with mild pulmonary hypertension. 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 PCWP mean 17 mm Hg. LVEDP 20 mm Hg Mild pulmonary HTN. PAP mean 30 mm Hg Reduced cardiac output. Co-ox 60 with Cardiac output 3.25 L/min. Index 2.06. Plan: medical therapy.  ? ?DG Chest Portable 1 View ? ?Result Date: 01/01/2022 ?CLINICAL DATA:  Shortness of breath EXAM: PORTABLE CHEST 1 VIEW COMPARISON:  Chest x-ray dated December 19, 2021 FINDINGS: Unchanged cardiomegaly. Mild bilateral interstitial opacities. No focal consolidation. No large pleural effusion or pneumothorax. IMPRESSION: 1. Mild bilateral interstitial opacities, concerning for pulmonary edema. 2. Unchanged cardiomegaly. Electronically Signed   By: Allegra Lai M.D.   On: 01/01/2022 20:06  ? ?  ECHOCARDIOGRAM COMPLETE ? ?Result Date: 01/02/2022 ?   ECHOCARDIOGRAM REPORT   Patient Name:   Nancy Marsh Date of Exam: 01/02/2022 Medical Rec #:  [id number]        Height:       64.0 in Accession #:    [id number]       Weight:       122.0 lb Date of Birth:  08/29/1956        BSA:          1.586 m? Patient Age:    66 years         BP:           114/66 mmHg Patient Gender: F                HR:           87 bpm. Exam Location:  Inpatient Procedure: 2D Echo, Cardiac Doppler, Color Doppler and Intracardiac            Opacification Agent Indications:    CHF  History:        Patient has no prior history of Echocardiogram examinations.                 COPD, Arrythmias:LBBB; Risk Factors:Hypertension.  Sonographer:    Cleatis Polka Referring Phys: [phone] JARED M GARDNER IMPRESSIONS  1. Left ventricular ejection fraction, by estimation, is 20 to 25%. The left ventricle has severely decreased function. The  left ventricle demonstrates global hypokinesis. There is mild left ventricular hypertrophy. Left ventricular diastolic parameters  are consistent with Grade I diastolic dysfunction (impaired relaxation).  2. Ri

## 2022-01-04 NOTE — TOC Transition Note (Addendum)
Transition of Care (TOC) - CM/SW Discharge Note ? ? ?Patient Details  ?Name: Nancy Marsh ?MRN: [id number] ?Date of Birth: 12-30-1955 ? ?Transition of Care (TOC) CM/SW Contact:  ?Konrad Penta, RN ?Phone Number: [phone] ?01/04/2022, 9:33 AM ? ? ?Clinical Narrative:  Spoke with Ms. Pierron who confirms [employer] is closed on Sundays. Request prescriptions be sent to [facility] on [address]. Made MD and nursing aware. Also discussed that [facility] contact information was placed on dc paperwork for her to call to schedule appointment. Office closed over the weekend. Ms. Moten expresses understanding. States daughter will pick her up today for dc.  ? ?No further TOC needs identified. Please re-consult if needed.  ? ?Addendum 1035: Provided Jardiance and Entresto coupon cards to patient. Ensured Cardiology follow up information on dc paper work.  ? ? ? ?Final next level of care: Home/Self Care ?Barriers to Discharge: No Barriers Identified ? ? ?Patient Goals and CMS Choice ?Patient states their goals for this hospitalization and ongoing recovery are:: return home ?  ?  ? ?Discharge Placement ?  ?           ?  ?  ?  ?  ? ?Discharge Plan and Services ?  ?  ?           ?  ?  ?  ?  ?  ?  ?  ?  ?  ?  ? ?Social Determinants of Health (SDOH) Interventions ?  ? ? ?Readmission Risk Interventions ?   ? View : No data to display.  ?  ?  ?  ? ? ? ? ? ?

## 2022-01-05 ENCOUNTER — Encounter (HOSPITAL_COMMUNITY): Payer: Self-pay | Admitting: Cardiology

## 2022-01-12 ENCOUNTER — Ambulatory Visit: Payer: Medicare Other | Admitting: Cardiology

## 2022-02-02 NOTE — Progress Notes (Unsigned)
Cardiology Office Note:    Date:  02/04/2022   ID:  Nancy Marsh, DOB Feb 23, 1956, MRN [id number]  PCP:  Nicholes Rough, PA-C   [facility] Providers Cardiologist:  Candee Furbish, MD     Referring MD: Nicholes Rough, PA-C   Chief Complaint: Hospital follow-up CHF  History of Present Illness:    Nancy Marsh is a very pleasant 66 y.o. female with a hx of tobacco abuse, COPD, nonobstructive CAD, LBBB, and hyperlipidemia.   She presented to [facility] ED on 01/01/2022 for shortness of breath and nonproductive cough not improved with MDI.  Per report of first responders, she was tripoding and in respiratory distress.  Albuterol nebs given and placed on CPAP. Hs troponin 57 ? 115, BNP 1864, transaminases in 100s with normal bilirubin.  EKG showed known LBBB with ST/T wave changes consistent with repolarization abnormality.  Mild pulmonary edema on CXR.  Initially treated as COPD exacerbation however, then cardiology was consulted for suspicion of heart failure.  Echo revealed global hypokinesis with LVEF 20 to 25%, mild LVH, normal RV, grade 1 diastolic dysfunction, mild MR, AV sclerosis without AS. Right and left heart cath revealed nonobstructive CAD with 35% lesion mid RCA, ost LAD to prox LAD 30% stenosis significant myocardial bridging of the distal LAD, mild elevated LV filling pressures.  PCWP mean 17 mmHg, LVEDP 20 mmHg, mild pulmonary hypertension.  PAP mean 30 mmHg, reduced cardiac output. She was initiated on GDMT including Jardiance 10 mg daily and Entresto 24/26 mg twice daily.  Holding on beta-blocker due to low cardiac output.  Avoiding aspirin in the setting of anaphylaxis to NSAIDs. Plan to repeat echo in 3 months and if no improvement in EF would need to consider BiV ICD due to chronic LBBB.  She was discharged on 01/04/2022.  Today, she is here with her best friend.  She reports she is doing well and breathing has improved drastically since prior to hospitalization. States for several  months she did not understand why she was having such difficulty breathing and felt like it was more than COPD. Reports home weight is stable. Admits poor po intake, per her friend "she eats like a bird."  Does not like the taste of water she has reduced smoking from 1 ppd to 4 cigarettes daily. Has set a quit date of June 8. She denies chest pain, lower extremity edema, fatigue, palpitations, melena, hematuria, hemoptysis, diaphoresis, weakness, presyncope, syncope, orthopnea, and PND.  Past Medical History:  Diagnosis Date   COPD (chronic obstructive pulmonary disease) (HCC)    Degenerative disc disease, lumbar    Hypertension    Neuropathy    Panic attack     Past Surgical History:  Procedure Laterality Date   BACK SURGERY     CHOLECYSTECTOMY     RIGHT/LEFT HEART CATH AND CORONARY ANGIOGRAPHY N/A 01/02/2022   Procedure: RIGHT/LEFT HEART CATH AND CORONARY ANGIOGRAPHY;  Surgeon: Martinique, Peter M, MD;  Location: [place] [facility];  Service: Cardiovascular;  Laterality: N/A;    Current Medications: Current Meds  Medication Sig   albuterol (VENTOLIN HFA) 108 (90 Base) MCG/ACT inhaler Inhale 1 puff into the lungs every 6 (six) hours as needed for wheezing or shortness of breath.   baclofen (LIORESAL) 10 MG tablet Take 1 tablet (10 mg total) by mouth 3 (three) times daily. (Patient taking differently: Take 10 mg by mouth as needed for muscle spasms.)   DULoxetine (CYMBALTA) 30 MG capsule Take 1 capsule (30 mg total) by mouth daily.  Fluticasone-Umeclidin-Vilant (TRELEGY ELLIPTA) 200-62.5-25 MCG/ACT AEPB Inhale 1 puff into the lungs daily.   nicotine (NICODERM CQ - DOSED IN MG/24 HOURS) 14 mg/24hr patch Place 1 patch (14 mg total) onto the skin daily.   oxyCODONE-acetaminophen (PERCOCET/ROXICET) 5-325 MG tablet Take 1 tablet by mouth every 8 (eight) hours as needed.   traZODone (DESYREL) 50 MG tablet Take 50 mg by mouth at bedtime.   Vitamin D, Ergocalciferol, (DRISDOL) 1.25 MG (50000 UNIT)  CAPS capsule Take 50,000 Units by mouth every 7 (seven) days.   [DISCONTINUED] empagliflozin (JARDIANCE) 10 MG TABS tablet Take 1 tablet (10 mg total) by mouth daily.   [DISCONTINUED] metoprolol succinate (TOPROL-XL) 25 MG 24 hr tablet Take 0.5 tablets (12.5 mg total) by mouth daily.   [DISCONTINUED] rosuvastatin (CRESTOR) 40 MG tablet Take 40 mg by mouth daily.   [DISCONTINUED] sacubitril-valsartan (ENTRESTO) 24-26 MG Take 1 tablet by mouth 2 (two) times daily.     Allergies:   Ibuprofen and Nsaids   Social History   Socioeconomic History   Marital status: Legally Separated    Spouse name: Not on file   Number of children: Not on file   Years of education: Not on file   Highest education level: Not on file  Occupational History   Not on file  Tobacco Use   Smoking status: Every Day    Packs/day: 0.50    Types: Cigarettes   Smokeless tobacco: Never  Substance and Sexual Activity   Alcohol use: No   Drug use: Not on file   Sexual activity: Not on file  Other Topics Concern   Not on file  Social History Narrative   Not on file   Social Determinants of Health   Financial Resource Strain: Not on file  Food Insecurity: Not on file  Transportation Needs: Not on file  Physical Activity: Not on file  Stress: Not on file  Social Connections: Not on file     Family History: The patient's family history is not on file.  ROS:   Please see the history of present illness.   + dyspnea on exertion All other systems reviewed and are negative.  Labs/Other Studies Reviewed:    The following studies were reviewed today:  [facility] 01/02/22    Mid RCA lesion is 35% stenosed.   Ost LAD to Prox LAD lesion is 30% stenosed.   LV end diastolic pressure is mildly elevated.   Hemodynamic findings consistent with mild pulmonary hypertension.   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 PCWP mean 17 mm Hg. LVEDP 20  mm Hg Mild pulmonary HTN. PAP mean 30 mm Hg Reduced cardiac output. Co-ox 60 with Cardiac output 3.25 L/min. Index 2.06.   Plan: medical therapy.  Echo 01/02/22  1. Left ventricular ejection fraction, by estimation, is 20 to 25%. The  left ventricle has severely decreased function. The left ventricle  demonstrates global hypokinesis. There is mild left ventricular  hypertrophy. Left ventricular diastolic parameters   are consistent with Grade I diastolic dysfunction (impaired relaxation).   2. Right ventricular systolic function is normal. The right ventricular  size is normal.   3. The mitral valve is normal in structure. Mild mitral valve  regurgitation. No evidence of mitral stenosis.   4. The aortic valve is tricuspid. There is mild calcification of the  aortic valve. There is mild thickening of the aortic valve. Aortic valve  regurgitation is not visualized. Aortic valve sclerosis/calcification is  present, without any evidence of  aortic stenosis.   5. The inferior vena cava is normal in size with greater than 50%  respiratory variability, suggesting right atrial pressure of 3 mmHg.   Recent Labs: 01/01/2022: B Natriuretic Peptide 1,863.9; TSH 0.642 01/04/2022: ALT 91; BUN 21; Creatinine, Ser 0.79; Hemoglobin 15.1; Magnesium 1.9; Platelets 185; Potassium 3.7; Sodium 138   Recent Lipid Panel    Component Value Date/Time   CHOL 248 (H) 01/02/2022 0310   TRIG 38 01/02/2022 0310   HDL 73 01/02/2022 0310   CHOLHDL 3.4 01/02/2022 0310   VLDL 8 01/02/2022 0310   LDLCALC 167 (H) 01/02/2022 0310     Risk Assessment/Calculations:       Physical Exam:    VS:  BP 110/68   Pulse 80   Ht 5' 4" (1.626 m)   Wt 123 lb (55.8 kg)   SpO2 95%   BMI 21.11 kg/m     Wt Readings from Last 3 Encounters:  02/04/22 123 lb (55.8 kg)  01/04/22 120 lb 2.4 oz (54.5 kg)  12/19/21 126 lb (57.2 kg)     GEN:  Thin female in no acute distress HEENT: Normal NECK: No JVD; No carotid  bruits CARDIAC: RRR, no murmurs, rubs, gallops RESPIRATORY:  Clear to auscultation without rales, wheezing or rhonchi  ABDOMEN: Soft, non-tender, non-distended MUSCULOSKELETAL:  No edema; No deformity. 2+ pedal pulses, equal bilaterally SKIN: Warm and dry NEUROLOGIC:  Alert and oriented x 3 PSYCHIATRIC:  Normal affect   EKG:  EKG is not ordered today.    Diagnoses:    1. LBBB (left bundle branch block)   2. Primary hypertension   3. Acute on chronic combined systolic and diastolic CHF, NYHA class 2 ([place])   4. NICM (nonischemic cardiomyopathy) ([place])   5. Coronary artery disease involving native coronary artery of native heart without angina pectoris   6. Tobacco abuse   7. Mild malnutrition (HCC)    Assessment and Plan:     NICM/Acute on chronic combined CHF: LVEF 20 to 25%, G1 DD, mild LVH by echo 4/28.  Appears euvolemic today on exam.  She feels shortness of breath is stable.  No orthopnea, PND, edema.  Continue daily weights.  We have given her a BP cuff for monitoring at home.  Limit fluids to 2 L daily. We discussed cost of medications and patient assistance forms have been given for Entresto. She is tolerating medications without any reported symptoms.  Was started on low-dose metoprolol prior to discharge by internal medicine for 12 beats of NSVT.  We will continue for now.  Consideration for adding spironolactone was given, however BP is soft.  We will have her monitor home BP and bring to next appointment.  Continue GDMT including Entresto, Jardiance, metoprolol.  CAD without angina: Mild nonobstructive disease mid RCA, ost LAD to proximal LAD by cardiac cath 4/20.  Elevated troponin felt to be secondary to heart failure exacerbation.  She denies chest pain, dyspnea, or other symptoms concerning for angina.  No indication for further ischemic evaluation at this time.   LBBB: As noted above, LVEF severely reduced.  She denies presyncope, syncope. Will plan for repeat  echocardiogram in 3 months and if EF reduction persist, will refer to EP for possible BiV ICD.   Mild malnutrition: Low albumin, protein noted on lab work during hospitalization.  She also had elevated transaminases.  She denies alcohol consumption. We discussed the importance of proper nutrition in the setting of treatment  of heart failure.  Reports often times she was not hungry, buit she is trying to do better about eating on a consistent basis/ I have given her some information on the Mediterranean eating plan.  Repeating complete metabolic panel today  Tobacco abuse: Has reduced number of cigarettes to 4 per day. Set a quit date of 6/8.  I congratulated her on this achievement.  Complete cessation advised.   Disposition: 3 months with APP then 6 months with Dr. Marlou Porch (patient requested [facility] office)   Medication Adjustments/Labs and Tests Ordered: Current medicines are reviewed at length with the patient today.  Concerns regarding medicines are outlined above.  Orders Placed This Encounter  Procedures   Comp Met (CMET)   Meds ordered this encounter  Medications   DISCONTD: empagliflozin (JARDIANCE) 10 MG TABS tablet    Sig: Take 1 tablet (10 mg total) by mouth daily.    Dispense:  30 tablet    Refill:  11   DISCONTD: metoprolol succinate (TOPROL-XL) 25 MG 24 hr tablet    Sig: Take 0.5 tablets (12.5 mg total) by mouth daily.    Dispense:  15 tablet    Refill:  11   rosuvastatin (CRESTOR) 40 MG tablet    Sig: Take 1 tablet (40 mg total) by mouth daily.    Dispense:  30 tablet    Refill:  11   DISCONTD: sacubitril-valsartan (ENTRESTO) 24-26 MG    Sig: Take 1 tablet by mouth 2 (two) times daily.    Dispense:  60 tablet    Refill:  11   sacubitril-valsartan (ENTRESTO) 24-26 MG    Sig: Take 1 tablet by mouth 2 (two) times daily.    Dispense:  60 tablet    Refill:  11   empagliflozin (JARDIANCE) 10 MG TABS tablet    Sig: Take 1 tablet (10 mg total) by mouth daily.     Dispense:  30 tablet    Refill:  11   metoprolol succinate (TOPROL-XL) 25 MG 24 hr tablet    Sig: Take 0.5 tablets (12.5 mg total) by mouth daily.    Dispense:  15 tablet    Refill:  11    Patient Instructions  Medication Instructions:   Your physician recommends that you continue on your current medications as directed. Please refer to the Current Medication list given to you today.   *If you need a refill on your cardiac medications before your next appointment, please call your pharmacy*   Lab Work:  TODAY!!!!! CMET  If you have labs (blood work) drawn today and your tests are completely normal, you will receive your results only by: [place] (if you have MyChart) OR A paper copy in the mail If you have any lab test that is abnormal or we need to change your treatment, we will call you to review the results.   Testing/Procedures:  None ordered.    Follow-Up: At [facility], you and your health needs are our priority.  As part of our continuing mission to provide you with exceptional heart care, we have created designated Provider Care Teams.  These Care Teams include your primary Cardiologist (physician) and Advanced Practice Providers (APPs -  Physician Assistants and Nurse Practitioners) who all work together to provide you with the care you need, when you need it.  We recommend signing up for the patient portal called "MyChart".  Sign up information is provided on this After Visit Summary.  MyChart is used to connect with patients  for Virtual Visits (Telemedicine).  Patients are able to view lab/test results, encounter notes, upcoming appointments, etc.  Non-urgent messages can be sent to your provider as well.   To learn more about what you can do with MyChart, go to [email or web address].    Your next appointment:   3 month(s)  The format for your next appointment:   In Person  Provider:   Christen Bame, NP     Then,  Dr. Candee Furbish will plan to  see you again in 6 month(s).    Other Instructions  Heart Failure Education: Weigh yourself EVERY morning after you go to the bathroom but before you eat or drink anything. Write this number down in a weight log/diary. If you gain 3 pounds overnight or 5 pounds in a week, call the office. Take your medicines as prescribed. If you have concerns about your medications, please call us before you stop taking them.  Eat low salt foods--Limit salt (sodium) to 2000 mg per day. This will help prevent your body from holding onto fluid. Read food labels as many processed foods have a lot of sodium, especially canned goods and prepackaged meats. If you would like some assistance choosing low sodium foods, we would be happy to set you up with a nutritionist. Stay as active as you can everyday. Staying active will give you more energy and make your muscles stronger. Start with 5 minutes at a time and work your way up to 30 minutes a day. Break up your activities--do some in the morning and some in the afternoon. Start with 3 days per week and work your way up to 5 days as you can.  If you have chest pain, feel short of breath, dizzy, or lightheaded, STOP. If you don't feel better after a short rest, call 911. If you do feel better, call the office to let us know you have symptoms with exercise. Limit all fluids for the day to less than 2 liters. Fluid includes all drinks, coffee, juice, ice chips, soup, jello, and all other liquids.   Mediterranean Diet A Mediterranean diet refers to food and lifestyle choices that are based on the traditions of countries located on the [employer]. It focuses on eating more fruits, vegetables, whole grains, beans, nuts, seeds, and heart-healthy fats, and eating less dairy, meat, eggs, and processed foods with added sugar, salt, and fat. This way of eating has been shown to help prevent certain conditions and improve outcomes for people who have chronic diseases, like kidney  disease and heart disease. What are tips for following this plan? Reading food labels Check the serving size of packaged foods. For foods such as rice and pasta, the serving size refers to the amount of cooked product, not dry. Check the total fat in packaged foods. Avoid foods that have saturated fat or trans fats. Check the ingredient list for added sugars, such as corn syrup. Shopping  Buy a variety of foods that offer a balanced diet, including: Fresh fruits and vegetables (produce). Grains, beans, nuts, and seeds. Some of these may be available in unpackaged forms or large amounts (in bulk). Fresh seafood. Poultry and eggs. Low-fat dairy products. Buy whole ingredients instead of prepackaged foods. Buy fresh fruits and vegetables in-season from local farmers markets. Buy plain frozen fruits and vegetables. If you do not have access to quality fresh seafood, buy precooked frozen shrimp or canned fish, such as tuna, salmon, or sardines. Stock your pantry so you always have certain foods  on hand, such as olive oil, canned tuna, canned tomatoes, rice, pasta, and beans. Cooking Cook foods with extra-virgin olive oil instead of using butter or other vegetable oils. Have meat as a side dish, and have vegetables or grains as your main dish. This means having meat in small portions or adding small amounts of meat to foods like pasta or stew. Use beans or vegetables instead of meat in common dishes like chili or lasagna. Experiment with different cooking methods. Try roasting, broiling, steaming, and sauting vegetables. Add frozen vegetables to soups, stews, pasta, or rice. Add nuts or seeds for added healthy fats and plant protein at each meal. You can add these to yogurt, salads, or vegetable dishes. Marinate fish or vegetables using olive oil, lemon juice, garlic, and fresh herbs. Meal planning Plan to eat one vegetarian meal one day each week. Try to work up to two vegetarian meals, if  possible. Eat seafood two or more times a week. Have healthy snacks readily available, such as: Vegetable sticks with hummus. Greek yogurt. Fruit and nut trail mix. Eat balanced meals throughout the week. This includes: Fruit: 2-3 servings a day. Vegetables: 4-5 servings a day. Low-fat dairy: 2 servings a day. Fish, poultry, or lean meat: 1 serving a day. Beans and legumes: 2 or more servings a week. Nuts and seeds: 1-2 servings a day. Whole grains: 6-8 servings a day. Extra-virgin olive oil: 3-4 servings a day. Limit red meat and sweets to only a few servings a month. Lifestyle  Cook and eat meals together with your family, when possible. Drink enough fluid to keep your urine pale yellow. Be physically active every day. This includes: Aerobic exercise like running or swimming. Leisure activities like gardening, walking, or housework. Get 7-8 hours of sleep each night. If recommended by your health care provider, drink red wine in moderation. This means 1 glass a day for nonpregnant women and 2 glasses a day for men. A glass of wine equals 5 oz (150 mL). What foods should I eat? Fruits Apples. Apricots. Avocado. Berries. Bananas. Cherries. Dates. Figs. Grapes. Lemons. Melon. Oranges. Peaches. Plums. Pomegranate. Vegetables Artichokes. Beets. Broccoli. Cabbage. Carrots. Eggplant. Green beans. Chard. Kale. Spinach. Onions. Leeks. Peas. Squash. Tomatoes. Peppers. Radishes. Grains Whole-grain pasta. Brown rice. Bulgur wheat. Polenta. Couscous. Whole-wheat bread. [place]. Meats and other proteins Beans. Almonds. Sunflower seeds. Pine nuts. Peanuts. [place]. Salmon. Scallops. Shrimp. [place]. Tilapia. Clams. Oysters. Eggs. Poultry without skin. Dairy Low-fat milk. Cheese. Greek yogurt. Fats and oils Extra-virgin olive oil. Avocado oil. Grapeseed oil. Beverages Water. Red wine. Herbal tea. Sweets and desserts Greek yogurt with honey. Baked apples. Poached pears. Trail  mix. Seasonings and condiments Basil. Cilantro. Coriander. Cumin. Mint. Parsley. Sage. Rosemary. Tarragon. Garlic. Oregano. Thyme. Pepper. Balsamic vinegar. Tahini. Hummus. Tomato sauce. Olives. Mushrooms. The items listed above may not be a complete list of foods and beverages you can eat. Contact a dietitian for more information. What foods should I limit? This is a list of foods that should be eaten rarely or only on special occasions. Fruits Fruit canned in syrup. Vegetables Deep-fried potatoes (french fries). Grains Prepackaged pasta or rice dishes. Prepackaged cereal with added sugar. Prepackaged snacks with added sugar. Meats and other proteins Beef. Pork. Lamb. Poultry with skin. Hot dogs. Berniece Salines. Dairy Ice cream. Sour cream. Whole milk. Fats and oils Butter. Canola oil. Vegetable oil. Beef fat (tallow). Lard. Beverages Juice. Sugar-sweetened soft drinks. Beer. Liquor and spirits. Sweets and desserts Cookies. Cakes. Pies. Candy. Seasonings and condiments  Mayonnaise. Pre-made sauces and marinades. The items listed above may not be a complete list of foods and beverages you should limit. Contact a dietitian for more information. Summary The Mediterranean diet includes both food and lifestyle choices. Eat a variety of fresh fruits and vegetables, beans, nuts, seeds, and whole grains. Limit the amount of red meat and sweets that you eat. If recommended by your health care provider, drink red wine in moderation. This means 1 glass a day for nonpregnant women and 2 glasses a day for men. A glass of wine equals 5 oz (150 mL). This information is not intended to replace advice given to you by your health care provider. Make sure you discuss any questions you have with your health care provider. Document Revised: 09/29/2019 Document Reviewed: 07/27/2019 Elsevier Patient Education  [place]         Signed, Emmaline Life, NP   02/04/2022 5:00 PM    [place]

## 2022-02-04 ENCOUNTER — Ambulatory Visit: Payer: Medicare Other | Admitting: Nurse Practitioner

## 2022-02-04 ENCOUNTER — Encounter: Payer: Self-pay | Admitting: Nurse Practitioner

## 2022-02-04 VITALS — BP 110/68 | HR 80 | Ht 64.0 in | Wt 123.0 lb

## 2022-02-04 DIAGNOSIS — I2510 Atherosclerotic heart disease of native coronary artery without angina pectoris: Secondary | ICD-10-CM

## 2022-02-04 DIAGNOSIS — I5043 Acute on chronic combined systolic (congestive) and diastolic (congestive) heart failure: Secondary | ICD-10-CM | POA: Diagnosis not present

## 2022-02-04 DIAGNOSIS — I447 Left bundle-branch block, unspecified: Secondary | ICD-10-CM | POA: Diagnosis not present

## 2022-02-04 DIAGNOSIS — Z720 Tobacco use: Secondary | ICD-10-CM

## 2022-02-04 DIAGNOSIS — I428 Other cardiomyopathies: Secondary | ICD-10-CM

## 2022-02-04 DIAGNOSIS — E441 Mild protein-calorie malnutrition: Secondary | ICD-10-CM

## 2022-02-04 DIAGNOSIS — I10 Essential (primary) hypertension: Secondary | ICD-10-CM | POA: Diagnosis not present

## 2022-02-04 LAB — COMPREHENSIVE METABOLIC PANEL
ALT: 18 IU/L (ref 0–32)
AST: 21 IU/L (ref 0–40)
Albumin/Globulin Ratio: 1.7 (ref 1.2–2.2)
Albumin: 4.1 g/dL (ref 3.8–4.8)
Alkaline Phosphatase: 98 IU/L (ref 44–121)
BUN/Creatinine Ratio: 12 (ref 12–28)
BUN: 10 mg/dL (ref 8–27)
Bilirubin Total: 0.4 mg/dL (ref 0.0–1.2)
CO2: 25 mmol/L (ref 20–29)
Calcium: 9.4 mg/dL (ref 8.7–10.3)
Chloride: 104 mmol/L (ref 96–106)
Creatinine, Ser: 0.81 mg/dL (ref 0.57–1.00)
Globulin, Total: 2.4 g/dL (ref 1.5–4.5)
Glucose: 86 mg/dL (ref 70–99)
Potassium: 3.8 mmol/L (ref 3.5–5.2)
Sodium: 143 mmol/L (ref 134–144)
Total Protein: 6.5 g/dL (ref 6.0–8.5)
eGFR: 81 mL/min/{1.73_m2} (ref >59)

## 2022-02-04 MED ORDER — SACUBITRIL-VALSARTAN 24-26 MG PO TABS: 1.0000 | ORAL_TABLET | Freq: Two times a day (BID) | ORAL | 11 refills | Status: DC

## 2022-02-04 MED ORDER — EMPAGLIFLOZIN 10 MG PO TABS: 10.0000 mg | ORAL_TABLET | Freq: Every day | ORAL | 11 refills | Status: DC

## 2022-02-04 MED ORDER — ROSUVASTATIN CALCIUM 40 MG PO TABS: 40.0000 mg | ORAL_TABLET | Freq: Every day | ORAL | 11 refills | Status: AC

## 2022-02-04 MED ORDER — METOPROLOL SUCCINATE ER 25 MG PO TB24
12.5000 mg | ORAL_TABLET | Freq: Every day | ORAL | 11 refills | Status: DC
Start: 2022-02-04 — End: 2023-04-19

## 2022-02-04 MED ORDER — METOPROLOL SUCCINATE ER 25 MG PO TB24: 12.5000 mg | ORAL_TABLET | Freq: Every day | ORAL | 11 refills | Status: DC

## 2022-02-04 NOTE — Patient Instructions (Signed)
Medication Instructions:   Your physician recommends that you continue on your current medications as directed. Please refer to the Current Medication list given to you today.   *If you need a refill on your cardiac medications before your next appointment, please call your pharmacy*   Lab Work:  TODAY!!!!! CMET  If you have labs (blood work) drawn today and your tests are completely normal, you will receive your results only by: MyChart Message (if you have MyChart) OR A paper copy in the mail If you have any lab test that is abnormal or we need to change your treatment, we will call you to review the results.   Testing/Procedures:  None ordered.    Follow-Up: At [facility], you and your health needs are our priority.  As part of our continuing mission to provide you with exceptional heart care, we have created designated Provider Care Teams.  These Care Teams include your primary Cardiologist (physician) and Advanced Practice Providers (APPs -  Physician Assistants and Nurse Practitioners) who all work together to provide you with the care you need, when you need it.  We recommend signing up for the patient portal called "MyChart".  Sign up information is provided on this After Visit Summary.  MyChart is used to connect with patients for Virtual Visits (Telemedicine).  Patients are able to view lab/test results, encounter notes, upcoming appointments, etc.  Non-urgent messages can be sent to your provider as well.   To learn more about what you can do with MyChart, go to [email or web address].    Your next appointment:   3 month(s)  The format for your next appointment:   In Person  Provider:   Eligha Bridegroom, NP     Then,  Dr. Donato Schultz will plan to see you again in 6 month(s).    Other Instructions  Heart Failure Education: Weigh yourself EVERY morning after you go to the bathroom but before you eat or drink anything. Write this number down in a weight  log/diary. If you gain 3 pounds overnight or 5 pounds in a week, call the office. Take your medicines as prescribed. If you have concerns about your medications, please call us before you stop taking them.  Eat low salt foods--Limit salt (sodium) to 2000 mg per day. This will help prevent your body from holding onto fluid. Read food labels as many processed foods have a lot of sodium, especially canned goods and prepackaged meats. If you would like some assistance choosing low sodium foods, we would be happy to set you up with a nutritionist. Stay as active as you can everyday. Staying active will give you more energy and make your muscles stronger. Start with 5 minutes at a time and work your way up to 30 minutes a day. Break up your activities--do some in the morning and some in the afternoon. Start with 3 days per week and work your way up to 5 days as you can.  If you have chest pain, feel short of breath, dizzy, or lightheaded, STOP. If you don't feel better after a short rest, call 911. If you do feel better, call the office to let us know you have symptoms with exercise. Limit all fluids for the day to less than 2 liters. Fluid includes all drinks, coffee, juice, ice chips, soup, jello, and all other liquids.   Mediterranean Diet A Mediterranean diet refers to food and lifestyle choices that are based on the traditions of countries located on the [employer].  It focuses on eating more fruits, vegetables, whole grains, beans, nuts, seeds, and heart-healthy fats, and eating less dairy, meat, eggs, and processed foods with added sugar, salt, and fat. This way of eating has been shown to help prevent certain conditions and improve outcomes for people who have chronic diseases, like kidney disease and heart disease. What are tips for following this plan? Reading food labels Check the serving size of packaged foods. For foods such as rice and pasta, the serving size refers to the amount of cooked  product, not dry. Check the total fat in packaged foods. Avoid foods that have saturated fat or trans fats. Check the ingredient list for added sugars, such as corn syrup. Shopping  Buy a variety of foods that offer a balanced diet, including: Fresh fruits and vegetables (produce). Grains, beans, nuts, and seeds. Some of these may be available in unpackaged forms or large amounts (in bulk). Fresh seafood. Poultry and eggs. Low-fat dairy products. Buy whole ingredients instead of prepackaged foods. Buy fresh fruits and vegetables in-season from local farmers markets. Buy plain frozen fruits and vegetables. If you do not have access to quality fresh seafood, buy precooked frozen shrimp or canned fish, such as tuna, salmon, or sardines. Stock your pantry so you always have certain foods on hand, such as olive oil, canned tuna, canned tomatoes, rice, pasta, and beans. Cooking Cook foods with extra-virgin olive oil instead of using butter or other vegetable oils. Have meat as a side dish, and have vegetables or grains as your main dish. This means having meat in small portions or adding small amounts of meat to foods like pasta or stew. Use beans or vegetables instead of meat in common dishes like chili or lasagna. Experiment with different cooking methods. Try roasting, broiling, steaming, and sauting vegetables. Add frozen vegetables to soups, stews, pasta, or rice. Add nuts or seeds for added healthy fats and plant protein at each meal. You can add these to yogurt, salads, or vegetable dishes. Marinate fish or vegetables using olive oil, lemon juice, garlic, and fresh herbs. Meal planning Plan to eat one vegetarian meal one day each week. Try to work up to two vegetarian meals, if possible. Eat seafood two or more times a week. Have healthy snacks readily available, such as: Vegetable sticks with hummus. Greek yogurt. Fruit and nut trail mix. Eat balanced meals throughout the week. This  includes: Fruit: 2-3 servings a day. Vegetables: 4-5 servings a day. Low-fat dairy: 2 servings a day. Fish, poultry, or lean meat: 1 serving a day. Beans and legumes: 2 or more servings a week. Nuts and seeds: 1-2 servings a day. Whole grains: 6-8 servings a day. Extra-virgin olive oil: 3-4 servings a day. Limit red meat and sweets to only a few servings a month. Lifestyle  Cook and eat meals together with your family, when possible. Drink enough fluid to keep your urine pale yellow. Be physically active every day. This includes: Aerobic exercise like running or swimming. Leisure activities like gardening, walking, or housework. Get 7-8 hours of sleep each night. If recommended by your health care provider, drink red wine in moderation. This means 1 glass a day for nonpregnant women and 2 glasses a day for men. A glass of wine equals 5 oz (150 mL). What foods should I eat? Fruits Apples. Apricots. Avocado. Berries. Bananas. Cherries. Dates. Figs. Grapes. Lemons. Melon. Oranges. Peaches. Plums. Pomegranate. Vegetables Artichokes. Beets. Broccoli. Cabbage. Carrots. Eggplant. Green beans. Chard. Kale. Spinach. Onions. Leeks. Peas.  Squash. Tomatoes. Peppers. Radishes. Grains Whole-grain pasta. Brown rice. Bulgur wheat. Polenta. Couscous. Whole-wheat bread. Orpah Cobb. Meats and other proteins Beans. Almonds. Sunflower seeds. Pine nuts. Peanuts. Cod. Salmon. Scallops. Shrimp. Tuna. Tilapia. Clams. Oysters. Eggs. Poultry without skin. Dairy Low-fat milk. Cheese. Greek yogurt. Fats and oils Extra-virgin olive oil. Avocado oil. Grapeseed oil. Beverages Water. Red wine. Herbal tea. Sweets and desserts Greek yogurt with honey. Baked apples. Poached pears. Trail mix. Seasonings and condiments Basil. Cilantro. Coriander. Cumin. Mint. Parsley. Sage. Rosemary. Tarragon. Garlic. Oregano. Thyme. Pepper. Balsamic vinegar. Tahini. Hummus. Tomato sauce. Olives. Mushrooms. The items listed  above may not be a complete list of foods and beverages you can eat. Contact a dietitian for more information. What foods should I limit? This is a list of foods that should be eaten rarely or only on special occasions. Fruits Fruit canned in syrup. Vegetables Deep-fried potatoes (french fries). Grains Prepackaged pasta or rice dishes. Prepackaged cereal with added sugar. Prepackaged snacks with added sugar. Meats and other proteins Beef. Pork. Lamb. Poultry with skin. Hot dogs. Tomasa Blase. Dairy Ice cream. Sour cream. Whole milk. Fats and oils Butter. Canola oil. Vegetable oil. Beef fat (tallow). Lard. Beverages Juice. Sugar-sweetened soft drinks. Beer. Liquor and spirits. Sweets and desserts Cookies. Cakes. Pies. Candy. Seasonings and condiments Mayonnaise. Pre-made sauces and marinades. The items listed above may not be a complete list of foods and beverages you should limit. Contact a dietitian for more information. Summary The Mediterranean diet includes both food and lifestyle choices. Eat a variety of fresh fruits and vegetables, beans, nuts, seeds, and whole grains. Limit the amount of red meat and sweets that you eat. If recommended by your health care provider, drink red wine in moderation. This means 1 glass a day for nonpregnant women and 2 glasses a day for men. A glass of wine equals 5 oz (150 mL). This information is not intended to replace advice given to you by your health care provider. Make sure you discuss any questions you have with your health care provider. Document Revised: 09/29/2019 Document Reviewed: 07/27/2019 Elsevier Patient Education  2023 Elsevier Inc.   Important Information About Sugar

## 2022-02-05 NOTE — Telephone Encounter (Signed)
This encounter was created in error - please disregard.

## 2022-02-05 NOTE — Progress Notes (Signed)
Pt has been made aware of normal result and verbalized understanding.  jw

## 2022-03-03 ENCOUNTER — Other Ambulatory Visit: Payer: Self-pay | Admitting: Orthopedic Surgery

## 2022-03-03 DIAGNOSIS — M5126 Other intervertebral disc displacement, lumbar region: Secondary | ICD-10-CM

## 2022-03-06 ENCOUNTER — Telehealth: Payer: Self-pay | Admitting: Cardiology

## 2022-03-06 NOTE — Telephone Encounter (Signed)
*  STAT* If patient is at the pharmacy, call can be transferred to refill team.   1. Which medications need to be refilled? (please list name of each medication and dose if known)   empagliflozin (JARDIANCE) 10 MG TABS tablet  2. Which pharmacy/location (including street and city if local pharmacy) is medication to be sent to? [facility] - [place], [place] - [address]  [address]  3. Do they need a 30 day or 90 day supply?   90 day  Patient is completely out of this medication.   She states the pharmacy is out of the medication and would not be able fill the prescription until Monday. She stated she had received samples before.

## 2022-03-06 NOTE — Telephone Encounter (Signed)
Called pt to inform her that we are leaving 1 box of Jardiance 10 mg tablet at the front desk for pt to pick up. I advised pt that if there is any other problems, questions or concerns, to give our office a call back. Pt verbalized understanding.    Lot# [id number]   Exp: 09/2023

## 2022-03-24 DIAGNOSIS — G8929 Other chronic pain: Secondary | ICD-10-CM | POA: Insufficient documentation

## 2022-04-10 ENCOUNTER — Telehealth: Payer: Self-pay

## 2022-04-10 ENCOUNTER — Other Ambulatory Visit: Payer: Self-pay

## 2022-04-10 MED ORDER — ENTRESTO 24-26 MG PO TABS: 1.0000 | ORAL_TABLET | Freq: Two times a day (BID) | ORAL | 3 refills | Status: AC

## 2022-04-10 NOTE — Telephone Encounter (Signed)
**Note De-Identified Gil Ingwersen Obfuscation** The pt left the providers page of her [employer] pt asst application for Entresto at the office with a request for [place] to complete and to fax to [employer] pt asst foundation.  I did call the pt to ask where her part of the application is so I can add a note on the cover letter advising [employer] where/how the pts part of her application is being sent to them to be sure they add the providers page with her application.  I got no answer so I left a VM asking the pt to call Larita Fife at Eligha Bridegroom, NP/[employer] office at [employer] concerning her [employer] pt asst application for [employer].

## 2022-04-10 NOTE — Telephone Encounter (Signed)
**Note De-Identified Ilona Colley Obfuscation** The pt called me back and stated that she faxed her part of her [employer] pt asst application for Entresto to [employer] yesterday.  I added a note on the cover letter stating that the pt faxed her part of her application to [employer] yesterday (8/3) and I requested that they add this providers page with her application.  I have faxed the providers page of the pts [employer] pt asst application to the nurse working with Dr Anne Fu today so she can print a Entresto 24-26 mg RX for #180 with 3 refills, obtain Dr Anne Fu signature/date on it and the application and to fax all to [employer] at the fax number written on the cover letter included.

## 2022-04-13 NOTE — Telephone Encounter (Signed)
Left a message informing pt providers portion of application was faxed to [employer] on Friday 04/10/22.  Advised to call the office with further concerns.

## 2022-04-13 NOTE — Telephone Encounter (Signed)
Patient states the prescriber application has to be sent to [employer]. She requests a call back.

## 2022-04-16 ENCOUNTER — Telehealth: Payer: Self-pay

## 2022-04-16 NOTE — Telephone Encounter (Signed)
**Note De-Identified Zamire Whitehurst Obfuscation** Letter received from [employer] Pt Asst Foundation stating that they have approve the pt for Entresto asst until 09/06/2022 Pt ID: [id number]  The letter states that they have notified the pt of this approval as well.

## 2022-04-16 NOTE — Telephone Encounter (Signed)
Pt c/o medication issue:  1. Name of Medication:   empagliflozin (JARDIANCE) 10 MG TABS tablet  2. How are you currently taking this medication (dosage and times per day)? As prescribed  3. Are you having a reaction (difficulty breathing--STAT)?   4. What is your medication issue?   Patient called stating she is out of this medication now and needs to get financial assistance with this medication

## 2022-04-16 NOTE — Telephone Encounter (Addendum)
**Note De-Identified Jamarea Selner Obfuscation** The pt states that she has completed her part of her [employer] application for Jardiance asst. She states that she is faxing her part in to [facility] herself, today. She also reports that she is out of [place].  I advised her that I will complete the providers page of a [employer] Pt Asst application and will e-mail to Dr Anne Fu nurse so she can obtain his signature, date it, and to fax all (3 pages) to [facility] Cares at the fax number written on the cover letter included.  She is aware that I am witting a note on the cover letter advising [employer] "To please add this provider's page with the pts part of the application for Jardiance that she is/has faxed to [facility] Cares herself".  She is also aware that we are leaving her 2 weeks of Jardiance 10 mg samples in the front office for her to pick up. She asked if her son can pick them up for her as does not drive when it is raining and I advised her that he can.  I have printed/completed the providers page of a [employer] application and have e-mailed all to Dr Anne Fu nurse.

## 2022-04-17 MED ORDER — EMPAGLIFLOZIN 10 MG PO TABS: 10.0000 mg | ORAL_TABLET | Freq: Every day | ORAL | 3 refills | Status: DC

## 2022-04-21 NOTE — Telephone Encounter (Signed)
This information was faxed 04/17/2022.

## 2022-04-27 ENCOUNTER — Telehealth: Payer: Self-pay

## 2022-04-27 NOTE — Telephone Encounter (Signed)
**Note De-Identified [name] Obfuscation** Letter received from [facility] stating that they have denied the pt for Jardiance assistance as they have determined that the pt may be eligible for Low Income Subsidy. The letter states that the pt needs to call Social Security at [phone] to determine her eligibility.  If the pt is denied Low Income Subsidy and can provide a denial letter, [employer] will reconsider their determination.  The letter states that they have mad the pt aware of this as well.

## 2022-04-27 NOTE — Progress Notes (Unsigned)
Cardiology Office Note:    Date:  04/29/2022   ID:  Nancy Marsh, DOB 1956/07/28, MRN [id number]  PCP:  Ladora Daniel, PA-C   [facility] Providers Cardiologist:  Donato Schultz, MD     Referring MD: Ladora Daniel, PA-C   Chief Complaint: Hospital follow-up CHF  History of Present Illness:    Nancy Marsh is a very pleasant 66 y.o. female with a hx of tobacco abuse, COPD, nonobstructive CAD, LBBB, and hyperlipidemia.   She presented to [facility] ED on 01/01/2022 for shortness of breath and nonproductive cough not improved with MDI.  Per report of first responders, she was tripoding and in respiratory distress.  Albuterol nebs given and placed on CPAP. Hs troponin 57 ? 115, BNP 1864, transaminases in 100s with normal bilirubin.  EKG showed known LBBB with ST/T wave changes consistent with repolarization abnormality.  Mild pulmonary edema on CXR.  Initially treated as COPD exacerbation however, then cardiology was consulted for suspicion of heart failure.  Echo revealed global hypokinesis with LVEF 20 to 25%, mild LVH, normal RV, grade 1 diastolic dysfunction, mild MR, AV sclerosis without AS. Right and left heart cath revealed nonobstructive CAD with 35% lesion mid RCA, ost LAD to prox LAD 30% stenosis significant myocardial bridging of the distal LAD, mild elevated LV filling pressures.  PCWP mean 17 mmHg, LVEDP 20 mmHg, mild pulmonary hypertension.  PAP mean 30 mmHg, reduced cardiac output. She was initiated on GDMT including Jardiance 10 mg daily and Entresto 24/26 mg twice daily.  Holding on beta-blocker due to low cardiac output.  Avoiding aspirin in the setting of anaphylaxis to NSAIDs. Plan to repeat echo in 3 months and if no improvement in EF would need to consider BiV ICD due to chronic LBBB.  She was discharged on 01/04/2022.  She was last seen in our office by me on 02/04/22  accompanied by her best friend. Reported  she is doing well and breathing has improved drastically since prior to  hospitalization. States for several months she did not understand why she was having such difficulty breathing and felt like it was more than COPD. Home weight is stable. Admits poor po intake, per her friend "she eats like a bird."  Does not like the taste of water she has reduced smoking from 1 ppd to 4 cigarettes daily. Has set a quit date of June 8. She denied chest pain, lower extremity edema, fatigue, palpitations, melena, hematuria, hemoptysis, diaphoresis, weakness, presyncope, syncope, orthopnea, and PND. Was advised to improve nutrition and monitor home BP.   Today, she is here alone for follow-up. Reports she is feeling well.  Got Entresto assistance, is still working on [employer] Took her last pill this morning. We provided samples. She denies dyspnea, orthopnea, PND, chest pain, presyncope, syncope, palpitations, edema. Does not exercise on a consistent basis but continues to do her regular daily activities without activity intolerance. Weight is decreased since last visit. Continues to struggle with appetite, has food but is finicky about what she eats.  Past Medical History:  Diagnosis Date   COPD (chronic obstructive pulmonary disease) (HCC)    Degenerative disc disease, lumbar    Hypertension    Neuropathy    Panic attack     Past Surgical History:  Procedure Laterality Date   BACK SURGERY     CHOLECYSTECTOMY     RIGHT/LEFT HEART CATH AND CORONARY ANGIOGRAPHY N/A 01/02/2022   Procedure: RIGHT/LEFT HEART CATH AND CORONARY ANGIOGRAPHY;  Surgeon: Swazil[place], Peter M, MD;  Location: [place] [facility];  Service: Cardiovascular;  Laterality: N/A;    Current Medications: Current Meds  Medication Sig   albuterol (VENTOLIN HFA) 108 (90 Base) MCG/ACT inhaler Inhale 1 puff into the lungs every 6 (six) hours as needed for wheezing or shortness of breath.   baclofen (LIORESAL) 10 MG tablet Take 1 tablet (10 mg total) by mouth 3 (three) times daily.   DULoxetine (CYMBALTA) 30 MG  capsule Take 1 capsule (30 mg total) by mouth daily.   empagliflozin (JARDIANCE) 10 MG TABS tablet Take 1 tablet (10 mg total) by mouth daily.   Fluticasone-Umeclidin-Vilant (TRELEGY ELLIPTA) 200-62.5-25 MCG/ACT AEPB Inhale 1 puff into the lungs daily.   metoprolol succinate (TOPROL-XL) 25 MG 24 hr tablet Take 0.5 tablets (12.5 mg total) by mouth daily.   nicotine (NICODERM CQ - DOSED IN MG/24 HOURS) 14 mg/24hr patch Place 1 patch (14 mg total) onto the skin daily.   oxyCODONE-acetaminophen (PERCOCET/ROXICET) 5-325 MG tablet Take 1 tablet by mouth every 8 (eight) hours as needed.   rosuvastatin (CRESTOR) 40 MG tablet Take 1 tablet (40 mg total) by mouth daily.   sacubitril-valsartan (ENTRESTO) 24-26 MG Take 1 tablet by mouth 2 (two) times daily.   traZODone (DESYREL) 50 MG tablet Take 50 mg by mouth at bedtime.   Vitamin D, Ergocalciferol, (DRISDOL) 1.25 MG (50000 UNIT) CAPS capsule Take 50,000 Units by mouth every 7 (seven) days.     Allergies:   Ibuprofen and Nsaids   Social History   Socioeconomic History   Marital status: Legally Separated    Spouse name: Not on file   Number of children: Not on file   Years of education: Not on file   Highest education level: Not on file  Occupational History   Not on file  Tobacco Use   Smoking status: Every Day    Packs/day: 0.50    Types: Cigarettes   Smokeless tobacco: Never  Substance and Sexual Activity   Alcohol use: No   Drug use: Not on file   Sexual activity: Not on file  Other Topics Concern   Not on file  Social History Narrative   Not on file   Social Determinants of Health   Financial Resource Strain: Not on file  Food Insecurity: Not on file  Transportation Needs: Not on file  Physical Activity: Not on file  Stress: Not on file  Social Connections: Not on file     Family History: The patient's family history is not on file.  ROS:   Please see the history of present illness.   + dyspnea on exertion All other  systems reviewed and are negative.  Labs/Other Studies Reviewed:    The following studies were reviewed today:  [facility] 01/02/22    Mid RCA lesion is 35% stenosed.   Ost LAD to Prox LAD lesion is 30% stenosed.   LV end diastolic pressure is mildly elevated.   Hemodynamic findings consistent with mild pulmonary hypertension.   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 PCWP mean 17 mm Hg. LVEDP 20 mm Hg Mild pulmonary HTN. PAP mean 30 mm Hg Reduced cardiac output. Co-ox 60 with Cardiac output 3.25 L/min. Index 2.06.   Plan: medical therapy.  Echo 01/02/22  1. Left ventricular ejection fraction, by estimation, is 20 to 25%. The  left ventricle has severely decreased function. The left ventricle  demonstrates global hypokinesis. There is mild left ventricular  hypertrophy. Left ventricular  diastolic parameters   are consistent with Grade I diastolic dysfunction (impaired relaxation).   2. Right ventricular systolic function is normal. The right ventricular  size is normal.   3. The mitral valve is normal in structure. Mild mitral valve  regurgitation. No evidence of mitral stenosis.   4. The aortic valve is tricuspid. There is mild calcification of the  aortic valve. There is mild thickening of the aortic valve. Aortic valve  regurgitation is not visualized. Aortic valve sclerosis/calcification is  present, without any evidence of  aortic stenosis.   5. The inferior vena cava is normal in size with greater than 50%  respiratory variability, suggesting right atrial pressure of 3 mmHg.   Recent Labs: 01/01/2022: B Natriuretic Peptide 1,863.9; TSH 0.642 01/04/2022: Hemoglobin 15.1; Magnesium 1.9; Platelets 185 02/04/2022: ALT 18; BUN 10; Creatinine, Ser 0.81; Potassium 3.8; Sodium 143   Recent Lipid Panel    Component Value Date/Time   CHOL 248 (H) 01/02/2022 0310   TRIG 38 01/02/2022 0310   HDL 73  01/02/2022 0310   CHOLHDL 3.4 01/02/2022 0310   VLDL 8 01/02/2022 0310   LDLCALC 167 (H) 01/02/2022 0310     Risk Assessment/Calculations:       Physical Exam:    VS:  BP 110/60 (BP Location: Left Arm, Patient Position: Sitting, Cuff Size: Normal)   Pulse 75   Ht 5\' 4"  (1.626 m)   Wt 114 lb (51.7 kg)   SpO2 96%   BMI 19.57 kg/m     Wt Readings from Last 3 Encounters:  04/29/22 114 lb (51.7 kg)  02/04/22 123 lb (55.8 kg)  01/04/22 120 lb 2.4 oz (54.5 kg)     GEN:  Thin female in no acute distress HEENT: Normal NECK: No JVD; No carotid bruits CARDIAC: RRR with gallop. No murmurs or rubs RESPIRATORY:  Clear to auscultation without rales, wheezing or rhonchi  ABDOMEN: Soft, non-tender, non-distended MUSCULOSKELETAL:  No edema; No deformity. 2+ pedal pulses, equal bilaterally SKIN: Warm and dry NEUROLOGIC:  Alert and oriented x 3 PSYCHIATRIC:  Normal affect   EKG:  EKG is not ordered today.    Diagnoses:    1. Chronic combined systolic and diastolic CHF, NYHA class 2 (HCC)   2. NICM (nonischemic cardiomyopathy) (HCC)   3. LBBB (left bundle branch block)   4. Coronary artery disease involving native coronary artery of native heart without angina pectoris   5. Tobacco abuse   6. Essential hypertension     Assessment and Plan:     NICM/Chronic combined CHF: NYHA Class II. LVEF 20 to 25%, G1 DD, mild LVH by echo 4/28.  Appears euvolemic today on exam. Feeling good, no activity intolerance. No orthopnea, PND, edema, dyspnea.  Continue daily weights. Limit fluids to 2 L daily. BP remains soft, no room to add spironolactone. Advised her to notify 5/28 if she does not get patient assistance for cost of medications. Will repeat echocardiogram. As noted below, with LBBB may refer to EP for consideration of BiV ICD if no improvement in EF.  Continue GDMT including Entresto, Jardiance, metoprolol.   CAD without angina: Mild nonobstructive disease mid RCA, ost LAD to proximal LAD  by cardiac cath 4/20.  She denies chest pain, dyspnea, or other symptoms concerning for angina. No indication for further ischemic evaluation at this time.  Continue rosuvastatin, metoprolol, empagliflozin.   LBBB: LVEF severely reduced. Denies presyncope, syncope. Will get echocardiogram and if EF reduction persist, will refer to EP for possible  BiV ICD.   Mild malnutrition: Albumin, protein, and transaminases have improved on recent lab work. Lengthy discussion about the importance of nutrition and hydration while also limiting excessive sodium and fluids.   Hypertension: BP is well controlled, a little soft.  No room to add additional GDMT for CHF.  Tobacco abuse: Has reduced number of cigarettes to 4 per day. Did not quit on previously set quit date. Complete cessation advised.   Disposition: 4 months with Dr. Anne Fu  Medication Adjustments/Labs and Tests Ordered: Current medicines are reviewed at length with the patient today.  Concerns regarding medicines are outlined above.  Orders Placed This Encounter  Procedures   ECHOCARDIOGRAM COMPLETE   No orders of the defined types were placed in this encounter.   Patient Instructions  Medication Instructions:   Your physician recommends that you continue on your current medications as directed. Please refer to the Current Medication list given to you today.   *If you need a refill on your cardiac medications before your next appointment, please call your pharmacy*   Lab Work:  None ordered.  If you have labs (blood work) drawn today and your tests are completely normal, you will receive your results only by: MyChart Message (if you have MyChart) OR A paper copy in the mail If you have any lab test that is abnormal or we need to change your treatment, we will call you to review the results.   Testing/Procedures:  Your physician has requested that you have an echocardiogram. Echocardiography is a painless test that uses sound  waves to create images of your heart. It provides your doctor with information about the size and shape of your heart and how well your heart's chambers and valves are working. This procedure takes approximately one hour. There are no restrictions for this procedure.    Follow-Up: At [facility], you and your health needs are our priority.  As part of our continuing mission to provide you with exceptional heart care, we have created designated Provider Care Teams.  These Care Teams include your primary Cardiologist (physician) and Advanced Practice Providers (APPs -  Physician Assistants and Nurse Practitioners) who all work together to provide you with the care you need, when you need it.  We recommend signing up for the patient portal called "MyChart".  Sign up information is provided on this After Visit Summary.  MyChart is used to connect with patients for Virtual Visits (Telemedicine).  Patients are able to view lab/test results, encounter notes, upcoming appointments, etc.  Non-urgent messages can be sent to your provider as well.   To learn more about what you can do with MyChart, go to [email or web address].    Your next appointment:   4 month(s)  The format for your next appointment:   In Person  Provider:   Donato Schultz, MD     Important Information About Sugar         Signed, Levi Aland, NP  04/29/2022 3:02 PM    [facility]

## 2022-04-29 ENCOUNTER — Ambulatory Visit: Payer: Medicare Other | Admitting: Nurse Practitioner

## 2022-04-29 ENCOUNTER — Encounter: Payer: Self-pay | Admitting: Nurse Practitioner

## 2022-04-29 VITALS — BP 110/60 | HR 75 | Ht 64.0 in | Wt 114.0 lb

## 2022-04-29 DIAGNOSIS — I2510 Atherosclerotic heart disease of native coronary artery without angina pectoris: Secondary | ICD-10-CM

## 2022-04-29 DIAGNOSIS — I5042 Chronic combined systolic (congestive) and diastolic (congestive) heart failure: Secondary | ICD-10-CM | POA: Diagnosis not present

## 2022-04-29 DIAGNOSIS — I447 Left bundle-branch block, unspecified: Secondary | ICD-10-CM | POA: Diagnosis not present

## 2022-04-29 DIAGNOSIS — I428 Other cardiomyopathies: Secondary | ICD-10-CM | POA: Diagnosis not present

## 2022-04-29 DIAGNOSIS — I10 Essential (primary) hypertension: Secondary | ICD-10-CM

## 2022-04-29 DIAGNOSIS — Z720 Tobacco use: Secondary | ICD-10-CM

## 2022-04-29 NOTE — Patient Instructions (Signed)
Medication Instructions:   Your physician recommends that you continue on your current medications as directed. Please refer to the Current Medication list given to you today.   *If you need a refill on your cardiac medications before your next appointment, please call your pharmacy*   Lab Work:  None ordered.  If you have labs (blood work) drawn today and your tests are completely normal, you will receive your results only by: MyChart Message (if you have MyChart) OR A paper copy in the mail If you have any lab test that is abnormal or we need to change your treatment, we will call you to review the results.   Testing/Procedures:  Your physician has requested that you have an echocardiogram. Echocardiography is a painless test that uses sound waves to create images of your heart. It provides your doctor with information about the size and shape of your heart and how well your heart's chambers and valves are working. This procedure takes approximately one hour. There are no restrictions for this procedure.    Follow-Up: At [facility], you and your health needs are our priority.  As part of our continuing mission to provide you with exceptional heart care, we have created designated Provider Care Teams.  These Care Teams include your primary Cardiologist (physician) and Advanced Practice Providers (APPs -  Physician Assistants and Nurse Practitioners) who all work together to provide you with the care you need, when you need it.  We recommend signing up for the patient portal called "MyChart".  Sign up information is provided on this After Visit Summary.  MyChart is used to connect with patients for Virtual Visits (Telemedicine).  Patients are able to view lab/test results, encounter notes, upcoming appointments, etc.  Non-urgent messages can be sent to your provider as well.   To learn more about what you can do with MyChart, go to [email or web address].    Your next appointment:    4 month(s)  The format for your next appointment:   In Person  Provider:   Donato Schultz, MD     Important Information About Sugar

## 2022-04-29 NOTE — Telephone Encounter (Signed)
Pt was at office visit on 04/29/22 with Eligha Bridegroom, NP and was given 2 weeks of Jardiance 10 mg tablets, because pt stated that she has applied for low income subsidy and waiting for letter and soon as pt get it, pt will bring copy to the office. Lot:  [id number]  Exp: 06/2024  Lorain Childes

## 2022-05-14 ENCOUNTER — Ambulatory Visit (HOSPITAL_COMMUNITY): Payer: Medicare Other | Attending: Cardiovascular Disease

## 2022-05-14 DIAGNOSIS — I5042 Chronic combined systolic (congestive) and diastolic (congestive) heart failure: Secondary | ICD-10-CM | POA: Diagnosis not present

## 2022-05-14 LAB — ECHOCARDIOGRAM COMPLETE
Area-P 1/2: 3.91 cm2
S' Lateral: 5.17 cm

## 2022-05-14 MED ORDER — PERFLUTREN LIPID MICROSPHERE
1.0000 mL | INTRAVENOUS | Status: AC | PRN
  Administered 2022-05-14: 1 mL via INTRAVENOUS

## 2022-05-15 ENCOUNTER — Other Ambulatory Visit: Payer: Self-pay | Admitting: *Deleted

## 2022-05-15 DIAGNOSIS — R931 Abnormal findings on diagnostic imaging of heart and coronary circulation: Secondary | ICD-10-CM

## 2022-05-15 DIAGNOSIS — I5042 Chronic combined systolic (congestive) and diastolic (congestive) heart failure: Secondary | ICD-10-CM

## 2022-05-18 ENCOUNTER — Telehealth: Payer: Self-pay | Admitting: Cardiology

## 2022-05-18 NOTE — Telephone Encounter (Signed)
S/w pt to let pt know will leave two weeks of jardiance samples up front.  Pt will bring in denial letter to give to Nancy Marsh to appeal.

## 2022-05-18 NOTE — Telephone Encounter (Signed)
Pt c/o medication issue:  1. Name of Medication: empagliflozin (JARDIANCE) 10 MG TABS tablet  2. How are you currently taking this medication (dosage and times per day)? As prescribed  3. Are you having a reaction (difficulty breathing--STAT)?  No  4. What is your medication issue?   Patient stated that she just received her denial letter from [employer] and she will be faxing the request to get this medication from [facility] Patient Assistance.  Patient stated that she is currently out of this medication and will need to get some medication to carry her until her assistance gets started.

## 2022-05-19 ENCOUNTER — Telehealth: Payer: Self-pay

## 2022-05-19 NOTE — Telephone Encounter (Signed)
**Note De-Identified Hector Taft Obfuscation** The pts [employer] application for Jardiance was completed and e-mailed to the nurse working with Dr Anne Fu this morning.  When I called to make the pt aware that we are leaving her 2 weeks of Jardiance 10 mg samples in the front office for her to pick up she stated that she was given samples while at the office yesterday.

## 2022-05-19 NOTE — Telephone Encounter (Signed)
**Note De-Identified Nancy Marsh Obfuscation** The pt left her completed [employer] pt asst application for Jardiance at the office.  I have completed the providers page of her application and have e-mailed all to the nurse working with Dr Anne Fu today so she can obtain his signature, date it, and to then fax all to [facility] Cares at the fax number written on the cover letter included.

## 2022-05-20 NOTE — Telephone Encounter (Signed)
**Note De-Identified Bharath Bernstein Obfuscation** Letter received from [facility] stating that they have approved the pt for Jardiance assistance until 09/06/2022.  The letter states that they have notified the pt of this approval as well.

## 2022-06-03 NOTE — Progress Notes (Unsigned)
Cardiology Office Note:    Date:  06/03/2022   ID:  Nancy Marsh, DOB December 24, 1955, MRN [id number]  PCP:  Nicholes Rough, [place] [facility] Cardiologist:  Candee Furbish, MD { Click to update primary MD,subspecialty MD or APP then REFRESH:1}    Referring MD: Ann Maki Lanice Schwab, NP   No chief complaint on file. ***  History of Present Illness:    Nancy Marsh is a 66 y.o. female with a hx of CHFrEF, nonobstructive CAD, LBBB, hypertension.  She was diagnosed with CHF at [facility] after presenting to [facility] with severe dyspnea. EF was 20-25%, without significant obstructive cardiomyopathy on left heart cath. She was noted to have LBBB. Despite continuing GDMT since discharge, EF in Sept, 2023 remains < 20%.   Arrhythmia History: LBBB diagnosed 12/2021 at the time of diagnosis of CHF   Past Medical History:  Diagnosis Date   COPD (chronic obstructive pulmonary disease) ([place])    Degenerative disc disease, lumbar    Hypertension    Neuropathy    Panic attack     Past Surgical History:  Procedure Laterality Date   BACK SURGERY     CHOLECYSTECTOMY     RIGHT/LEFT HEART CATH AND CORONARY ANGIOGRAPHY N/A 01/02/2022   Procedure: RIGHT/LEFT HEART CATH AND CORONARY ANGIOGRAPHY;  Surgeon: Martinique, Peter M, MD;  Location: [place] [facility];  Service: Cardiovascular;  Laterality: N/A;    Current Medications: No outpatient medications have been marked as taking for the 06/04/22 encounter (Appointment) with Susen Haskew, Yetta Barre, MD.     Allergies:   Ibuprofen and Nsaids   Social History   Socioeconomic History   Marital status: Legally Separated    Spouse name: Not on file   Number of children: Not on file   Years of education: Not on file   Highest education level: Not on file  Occupational History   Not on file  Tobacco Use   Smoking status: Every Day    Packs/day: 0.50    Types: Cigarettes   Smokeless tobacco: Never  Substance and Sexual Activity   Alcohol use:  No   Drug use: Not on file   Sexual activity: Not on file  Other Topics Concern   Not on file  Social History Narrative   Not on file   Social Determinants of Health   Financial Resource Strain: Not on file  Food Insecurity: Not on file  Transportation Needs: Not on file  Physical Activity: Not on file  Stress: Not on file  Social Connections: Not on file     Family History: The patient's ***family history is not on file.  ROS:   Please see the history of present illness.    *** All other systems reviewed and are negative.  EKGs/Labs/Other Studies Reviewed:    The following studies were reviewed today: TTE  01/02/2022: EF 20-25%, mild LVH Coronary angiogram 01/02/2022  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 PCWP mean 17 mm Hg. LVEDP 20 mm Hg Mild pulmonary HTN. PAP mean 30 mm Hg Reduced cardiac output. Co-ox 60 with Cardiac output 3.25 L/min. Index 2.06.   TTE: 05/14/2022 EF <20%, LV moderately dilated  EKG:  EKG is *** ordered today.    Orders placed or performed during the hospital encounter of 01/01/22   EKG 12-Lead   EKG 12-Lead   EKG 12-Lead   EKG 12-Lead     Recent Labs: 01/01/2022: B Natriuretic Peptide 1,863.9;  TSH 0.642 01/04/2022: Hemoglobin 15.1; Magnesium 1.9; Platelets 185 02/04/2022: ALT 18; BUN 10; Creatinine, Ser 0.81; Potassium 3.8; Sodium 143    Risk Assessment/Calculations:   {Does this patient have ATRIAL FIBRILLATION?:[phone]}  No BP recorded.  {Refresh Note OR Click here to enter BP  :1}***         Physical Exam:    VS:  There were no vitals taken for this visit.    Wt Readings from Last 3 Encounters:  04/29/22 114 lb (51.7 kg)  02/04/22 123 lb (55.8 kg)  01/04/22 120 lb 2.4 oz (54.5 kg)     GEN: *** Well nourished, well developed in no acute distress CARDIAC: ***RRR, no murmurs, rubs, gallops RESPIRATORY:  Normal work of breathing MUSCULOSKELETAL:  *** edema   ASSESSMENT:    No diagnosis found. PLAN:    In order of problems listed above:  Nonischemic cardiomyopathy: Chronic CHFrEF: EF is persistently < 20% despite 90 days of GDMT - will plan to proceed with CRT LBBB: qrs has typical morphology, significant V-V dyssynchrony on TTE, qrs < 150.       {Are you ordering a CV Procedure (e.g. stress test, cath, DCCV, TEE, etc)?   Press F2        :[id number]}    Medication Adjustments/Labs and Tests Ordered: Current medicines are reviewed at length with the patient today.  Concerns regarding medicines are outlined above.  No orders of the defined types were placed in this encounter.  No orders of the defined types were placed in this encounter.   There are no Patient Instructions on file for this visit.   Signed, Maurice Small, MD  06/03/2022 1:59 PM    [facility]

## 2022-06-03 NOTE — H&P (View-Only) (Signed)
Cardiology Office Note:    Date:  06/03/2022   ID:  Nancy Marsh, DOB May 05, 1956, MRN [id number]  PCP:  Ladora Daniel, PA-C   [facility] Providers Cardiologist:  Donato Schultz, MD     Referring MD: Levi Aland, NP   No chief complaint on file. "Sent to talk about a defibrillator" Fatigue and shortness of breath  History of Present Illness:    Nancy Marsh is a 66 y.o. female with a hx of CHFrEF, nonobstructive CAD, LBBB, hypertension.  She was diagnosed with CHF at [facility] after presenting to [facility] with severe dyspnea. EF was 20-25%, without significant obstructive cardiomyopathy on left heart cath. She was noted to have LBBB. Despite continuing GDMT since discharge, EF in Sept, 2023 remains < 20%.  She has chronic shortness of breath and fatigue. No chest pain, syncope, pre-syncope.  Arrhythmia History: LBBB diagnosed 12/2021 at the time of diagnosis of CHF   Past Medical History:  Diagnosis Date   COPD (chronic obstructive pulmonary disease) (HCC)    Degenerative disc disease, lumbar    Hypertension    Neuropathy    Panic attack     Past Surgical History:  Procedure Laterality Date   BACK SURGERY     CHOLECYSTECTOMY     RIGHT/LEFT HEART CATH AND CORONARY ANGIOGRAPHY N/A 01/02/2022   Procedure: RIGHT/LEFT HEART CATH AND CORONARY ANGIOGRAPHY;  Surgeon: [place]azil[place], Peter M, MD;  Location: [facility] INVASIVE CV LAB;  Service: Cardiovascular;  Laterality: N/A;    Current Medications: No outpatient medications have been marked as taking for the 06/04/22 encounter (Appointment) with Leisel Pinette, Roberts Gaudy, MD.     Allergies:   Ibuprofen and Nsaids   Social History   Socioeconomic History   Marital status: Legally Separated    Spouse name: Not on file   Number of children: Not on file   Years of education: Not on file   Highest education level: Not on file  Occupational History   Not on file  Tobacco Use   Smoking status: Every Day    Packs/day: 0.50    Types:  Cigarettes   Smokeless tobacco: Never  Substance and Sexual Activity   Alcohol use: No   Drug use: Not on file   Sexual activity: Not on file  Other Topics Concern   Not on file  Social History Narrative   Not on file   Social Determinants of Health   Financial Resource Strain: Not on file  Food Insecurity: Not on file  Transportation Needs: Not on file  Physical Activity: Not on file  Stress: Not on file  Social Connections: Not on file     Family History: The patient's family history is not on file.  ROS:   Please see the history of present illness.    All other systems reviewed and are negative.  EKGs/Labs/Other Studies Reviewed:    The following studies were reviewed today: TTE  01/02/2022: EF 20-25%, mild LVH Coronary angiogram 01/02/2022  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 PCWP mean 17 mm Hg. LVEDP 20 mm Hg Mild pulmonary HTN. PAP mean 30 mm Hg Reduced cardiac output. Co-ox 60 with Cardiac output 3.25 L/min. Index 2.06.   TTE: 05/14/2022 EF <20%, LV moderately dilated  EKG:  EKG is ordered today. It showed sinus rhythm with LBBB    Orders placed or performed during the hospital encounter of 01/01/22   EKG 12-Lead   EKG 12-Lead  EKG 12-Lead   EKG 12-Lead     Recent Labs: 01/01/2022: B Natriuretic Peptide 1,863.9; TSH 0.642 01/04/2022: Hemoglobin 15.1; Magnesium 1.9; Platelets 185 02/04/2022: ALT 18; BUN 10; Creatinine, Ser 0.81; Potassium 3.8; Sodium 143        Physical Exam:    VS:  There were no vitals taken for this visit.    Wt Readings from Last 3 Encounters:  04/29/22 114 lb (51.7 kg)  02/04/22 123 lb (55.8 kg)  01/04/22 120 lb 2.4 oz (54.5 kg)     GEN:  Well nourished, well developed in no acute distress CARDIAC: RRR, no murmurs, rubs, gallops RESPIRATORY:  Normal work of breathing MUSCULOSKELETAL: no edema   ASSESSMENT:    No diagnosis found. PLAN:     In order of problems listed above:  Nonischemic cardiomyopathy: Chronic CHFrEF: EF is persistently < 20% despite 90 days of GDMT - will plan to proceed with CRT. I discussed the indication for the procedure and the logistics, risks, potential benefit, and after care. I specifically explained that risks include but are not limited to infection, bleeding,damage to blood vessels, lung, and the heart -- but risk of prolonged hospitalization, need for surgery, or the event of stroke, heart attack, or death are low but not zero.   LBBB: qrs has typical morphology, significant V-V dyssynchrony on TTE, qrs > 150.         Medication Adjustments/Labs and Tests Ordered: Current medicines are reviewed at length with the patient today.  Concerns regarding medicines are outlined above.  No orders of the defined types were placed in this encounter.  No orders of the defined types were placed in this encounter.   There are no Patient Instructions on file for this visit.   Signed, Melida Quitter, MD  06/03/2022 1:59 PM    [place]

## 2022-06-04 ENCOUNTER — Encounter: Payer: Self-pay | Admitting: Cardiovascular Disease

## 2022-06-04 ENCOUNTER — Ambulatory Visit: Payer: Medicare Other | Attending: Cardiovascular Disease | Admitting: Cardiovascular Disease

## 2022-06-04 VITALS — BP 112/66 | HR 80 | Ht 64.0 in | Wt 114.0 lb

## 2022-06-04 DIAGNOSIS — I447 Left bundle-branch block, unspecified: Secondary | ICD-10-CM

## 2022-06-04 LAB — CBC
Hematocrit: 42.5 % (ref 34.0–46.6)
Hemoglobin: 14.6 g/dL (ref 11.1–15.9)
MCH: 28.1 pg (ref 26.6–33.0)
MCHC: 34.4 g/dL (ref 31.5–35.7)
MCV: 82 fL (ref 79–97)
Platelets: 162 10*3/uL (ref 150–450)
RBC: 5.2 x10E6/uL (ref 3.77–5.28)
RDW: 14.7 % (ref 11.7–15.4)
WBC: 4.1 10*3/uL (ref 3.4–10.8)

## 2022-06-04 LAB — BASIC METABOLIC PANEL
BUN/Creatinine Ratio: 11 — ABNORMAL LOW (ref 12–28)
BUN: 8 mg/dL (ref 8–27)
CO2: 29 mmol/L (ref 20–29)
Calcium: 10 mg/dL (ref 8.7–10.3)
Chloride: 103 mmol/L (ref 96–106)
Creatinine, Ser: 0.73 mg/dL (ref 0.57–1.00)
Glucose: 77 mg/dL (ref 70–99)
Potassium: 3.5 mmol/L (ref 3.5–5.2)
Sodium: 141 mmol/L (ref 134–144)
eGFR: 92 mL/min/{1.73_m2} (ref >59)

## 2022-06-04 NOTE — Patient Instructions (Signed)
Medication Instructions:  Your physician recommends that you continue on your current medications as directed. Please refer to the Current Medication list given to you today.  *If you need a refill on your cardiac medications before your next appointment, please call your pharmacy*  Lab Work: TODAY: CBC and BMET If you have labs (blood work) drawn today and your tests are completely normal, you will receive your results only by: [place] (if you have MyChart) OR A paper copy in the mail If you have any lab test that is abnormal or we need to change your treatment, we will call you to review the results.  Testing/Procedures: Your physician has recommended that you have a defibrillator inserted. An implantable cardioverter defibrillator (ICD) is a small device that is placed in your chest or, in rare cases, your abdomen. This device uses electrical pulses or shocks to help control life-threatening, irregular heartbeats that could lead the heart to suddenly stop beating (sudden cardiac arrest). Leads are attached to the ICD that goes into your heart. This is done in the hospital and usually requires an overnight stay. Please see the instruction sheet given to you today for more information.  Follow-Up: At [facility], you and your health needs are our priority.  As part of our continuing mission to provide you with exceptional heart care, we have created designated Provider Care Teams.  These Care Teams include your primary Cardiologist (physician) and Advanced Practice Providers (APPs -  Physician Assistants and Nurse Practitioners) who all work together to provide you with the care you need, when you need it.  Your next appointment:   See instruction letter   Important Information About Sugar

## 2022-06-09 NOTE — Pre-Procedure Instructions (Signed)
Attempted to call patient regarding procedure instructions for tomorrow.  Left voicemail on the following items: Arrival time 0830 Nothing to eat or drink after midnight No meds AM of procedure Responsible person to drive you home and stay with you for 24 hrs Wash with special soap night before and morning of procedure

## 2022-06-10 ENCOUNTER — Ambulatory Visit (HOSPITAL_COMMUNITY)
Admission: RE | Admit: 2022-06-10 | Discharge: 2022-06-10 | Disposition: A | Discharge status: Home / Self Care | Payer: Medicare Other | Source: Ambulatory Visit | Attending: Cardiovascular Disease | Admitting: Cardiovascular Disease

## 2022-06-10 ENCOUNTER — Other Ambulatory Visit: Payer: Self-pay

## 2022-06-10 ENCOUNTER — Encounter (HOSPITAL_COMMUNITY)
Admission: RE | Disposition: A | Discharge status: Home / Self Care | Payer: Self-pay | Source: Ambulatory Visit | Attending: Cardiovascular Disease

## 2022-06-10 ENCOUNTER — Ambulatory Visit (HOSPITAL_COMMUNITY): Payer: Medicare Other

## 2022-06-10 DIAGNOSIS — I5022 Chronic systolic (congestive) heart failure: Secondary | ICD-10-CM | POA: Insufficient documentation

## 2022-06-10 DIAGNOSIS — I110 Hypertensive heart disease with heart failure: Secondary | ICD-10-CM | POA: Insufficient documentation

## 2022-06-10 DIAGNOSIS — I447 Left bundle-branch block, unspecified: Secondary | ICD-10-CM | POA: Diagnosis not present

## 2022-06-10 DIAGNOSIS — F17210 Nicotine dependence, cigarettes, uncomplicated: Secondary | ICD-10-CM | POA: Diagnosis not present

## 2022-06-10 DIAGNOSIS — I428 Other cardiomyopathies: Secondary | ICD-10-CM | POA: Diagnosis not present

## 2022-06-10 DIAGNOSIS — I2510 Atherosclerotic heart disease of native coronary artery without angina pectoris: Secondary | ICD-10-CM | POA: Diagnosis not present

## 2022-06-10 HISTORY — PX: BIV ICD INSERTION CRT-D: EP1195

## 2022-06-10 SURGERY — BIV ICD INSERTION CRT-D

## 2022-06-10 MED ORDER — HEPARIN (PORCINE) IN NACL 1000-0.9 UT/500ML-% IV SOLN
INTRAVENOUS | Status: AC
  Filled 2022-06-10: qty 500

## 2022-06-10 MED ORDER — CEFAZOLIN SODIUM-DEXTROSE 1-4 GM/50ML-% IV SOLN
1.0000 g | Freq: Four times a day (QID) | INTRAVENOUS | Status: DC
  Administered 2022-06-10: 1 g via INTRAVENOUS
  Filled 2022-06-10: qty 50

## 2022-06-10 MED ORDER — HEPARIN (PORCINE) IN NACL 1000-0.9 UT/500ML-% IV SOLN
INTRAVENOUS | Status: DC | PRN
  Administered 2022-06-10: 500 mL

## 2022-06-10 MED ORDER — CHLORHEXIDINE GLUCONATE 4 % EX LIQD
4.0000 | Freq: Once | CUTANEOUS | Status: DC
  Filled 2022-06-10: qty 60

## 2022-06-10 MED ORDER — ONDANSETRON HCL 4 MG/2ML IJ SOLN: 4.0000 mg | Freq: Four times a day (QID) | INTRAMUSCULAR | Status: DC | PRN

## 2022-06-10 MED ORDER — OXYCODONE-ACETAMINOPHEN 5-325 MG PO TABS
ORAL_TABLET | ORAL | Status: AC
  Filled 2022-06-10: qty 1

## 2022-06-10 MED ORDER — CEFAZOLIN SODIUM-DEXTROSE 2-4 GM/100ML-% IV SOLN
INTRAVENOUS | Status: AC
  Filled 2022-06-10: qty 100

## 2022-06-10 MED ORDER — SODIUM CHLORIDE 0.9 % IV SOLN
INTRAVENOUS | Status: AC
  Filled 2022-06-10: qty 2

## 2022-06-10 MED ORDER — SODIUM CHLORIDE 0.9 % IV SOLN
80.0000 mg | INTRAVENOUS | Status: AC
  Administered 2022-06-10: 80 mg

## 2022-06-10 MED ORDER — FENTANYL CITRATE (PF) 100 MCG/2ML IJ SOLN
INTRAMUSCULAR | Status: AC
  Filled 2022-06-10: qty 2

## 2022-06-10 MED ORDER — CEFAZOLIN SODIUM-DEXTROSE 2-4 GM/100ML-% IV SOLN
2.0000 g | INTRAVENOUS | Status: AC
  Administered 2022-06-10: 2 g via INTRAVENOUS

## 2022-06-10 MED ORDER — IOHEXOL 350 MG/ML SOLN
INTRAVENOUS | Status: DC | PRN
  Administered 2022-06-10: 5 mL

## 2022-06-10 MED ORDER — MIDAZOLAM HCL 5 MG/5ML IJ SOLN
INTRAMUSCULAR | Status: AC
  Filled 2022-06-10: qty 5

## 2022-06-10 MED ORDER — POVIDONE-IODINE 10 % EX SWAB: 2.0000 | Freq: Once | CUTANEOUS | Status: DC

## 2022-06-10 MED ORDER — LIDOCAINE HCL (PF) 1 % IJ SOLN
INTRAMUSCULAR | Status: DC | PRN
  Administered 2022-06-10: 60 mL

## 2022-06-10 MED ORDER — MIDAZOLAM HCL 5 MG/5ML IJ SOLN
INTRAMUSCULAR | Status: DC | PRN
  Administered 2022-06-10 (×3): 1 mg via INTRAVENOUS

## 2022-06-10 MED ORDER — SODIUM CHLORIDE 0.9 % IV SOLN: INTRAVENOUS | Status: DC

## 2022-06-10 MED ORDER — FENTANYL CITRATE (PF) 100 MCG/2ML IJ SOLN
INTRAMUSCULAR | Status: DC | PRN
  Administered 2022-06-10 (×3): 25 ug via INTRAVENOUS

## 2022-06-10 MED ORDER — OXYCODONE-ACETAMINOPHEN 5-325 MG PO TABS
1.0000 | ORAL_TABLET | ORAL | Status: DC | PRN
  Administered 2022-06-10: 1 via ORAL

## 2022-06-10 MED ORDER — LIDOCAINE HCL 1 % IJ SOLN
INTRAMUSCULAR | Status: AC
  Filled 2022-06-10: qty 60

## 2022-06-10 MED ORDER — ACETAMINOPHEN 325 MG PO TABS: 325.0000 mg | ORAL_TABLET | ORAL | Status: DC | PRN

## 2022-06-10 SURGICAL SUPPLY — 17 items
BALLN COR SINUS VENO 6FR 80 (BALLOONS) ×1
BALLOON COR SINUS VENO 6FR 80 (BALLOONS) IMPLANT
CABLE SURGICAL S-101-97-12 (CABLE) ×1 IMPLANT
CATH CPS DIRECT 135 DS2C020 (CATHETERS) IMPLANT
ICD GALLANT HFCRTD CDHFA500Q (ICD Generator) IMPLANT
KIT MICROPUNCTURE NIT STIFF (SHEATH) IMPLANT
LEAD DURATA 7122Q-65CM (Lead) IMPLANT
LEAD QUARTET 1458QL-86 (Lead) IMPLANT
LEAD ULTIPACE 52 LPA1231/52 (Lead) IMPLANT
PAD DEFIB RADIO PHYSIO CONN (PAD) ×1 IMPLANT
QUARTET 1458QL-86 (Lead) ×1 IMPLANT
SHEATH 7FR PRELUDE SNAP 13 (SHEATH) IMPLANT
SHEATH CLASSIC 9.5F (SHEATH) IMPLANT
SLITTER AGILIS HISPRO (INSTRUMENTS) IMPLANT
TRAY PACEMAKER INSERTION (PACKS) ×1 IMPLANT
WIRE ACUITY WHISPER EDS 4648 (WIRE) IMPLANT
WIRE HI TORQ VERSACORE-J 145CM (WIRE) IMPLANT

## 2022-06-10 NOTE — Discharge Instructions (Signed)
After Your ICD (Implantable Cardiac Defibrillator)   You have a Abbott ICD  ACTIVITY Do not lift your arm above shoulder height for 1 week after your procedure. After 7 days, you may progress as below.  You should remove your sling 24 hours after your procedure, unless otherwise instructed by your provider.     Wednesday June 17, 2022  Thursday June 18, 2022 Friday June 19, 2022 Saturday June 20, 2022   Do not lift, push, pull, or carry anything over 10 pounds with the affected arm until 6 weeks (Wednesday July 22, 2022 ) after your procedure.   You may drive AFTER your wound check, unless you have been told otherwise by your provider.   Ask your healthcare provider when you can go back to work   INCISION/Dressing   If large square, outer bandage is left in place, this can be removed after 24 hours from your procedure. Do not remove steri-strips or glue as below.   Monitor your defibrillator site for redness, swelling, and drainage. Call the device clinic at [phone] if you experience these symptoms or fever/chills.  If your incision is sealed with Steri-strips or staples, you may shower 7 days after your procedure or when told by your provider. Do not remove the steri-strips or let the shower hit directly on your site. You may wash around your site with soap and water.    If you were discharged in a sling, please do not wear this during the day more than 48 hours after your surgery unless otherwise instructed. This may increase the risk of stiffness and soreness in your shoulder.   Avoid lotions, ointments, or perfumes over your incision until it is well-healed.  You may use a hot tub or a pool AFTER your wound check appointment if the incision is completely closed.  Your ICD is designed to protect you from life threatening heart rhythms. Because of this, you may receive a shock.   1 shock with no symptoms:  Call the office during business hours. 1 shock  with symptoms (chest pain, chest pressure, dizziness, lightheadedness, shortness of breath, overall feeling unwell):  Call 911. If you experience 2 or more shocks in 24 hours:  Call 911. If you receive a shock, you should not drive for 6 months per the [place] DMV IF you receive appropriate therapy from your ICD.   ICD Alerts:  Some alerts are vibratory and others beep. These are NOT emergencies. Please call our office to let us know. If this occurs at night or on weekends, it can wait until the next business day. Send a remote transmission.  If your device is capable of reading fluid status (for heart failure), you will be offered monthly monitoring to review this with you.   DEVICE MANAGEMENT Remote monitoring is used to monitor your ICD from home. This monitoring is scheduled every 91 days by our office. It allows [place] to keep an eye on the functioning of your device to ensure it is working properly. You will routinely see your Electrophysiologist annually (more often if necessary).   You should receive your ID card for your new device in 4-8 weeks. Keep this card with you at all times once received. Consider wearing a medical alert bracelet or necklace.  Your ICD  may be MRI compatible. This will be discussed at your next office visit/wound check.  You should avoid contact with strong electric or magnetic fields.   Do not use amateur (ham) radio equipment or electric (  arc) welding torches. MP3 player headphones with magnets should not be used. Some devices are safe to use if held at least 12 inches (30 cm) from your defibrillator. These include power tools, lawn mowers, and speakers. If you are unsure if something is safe to use, ask your health care provider.  When using your cell phone, hold it to the ear that is on the opposite side from the defibrillator. Do not leave your cell phone in a pocket over the defibrillator.  You may safely use electric blankets, heating pads, computers, and microwave  ovens.  Call the office right away if: You have chest pain. You feel more than one shock. You feel more short of breath than you have felt before. You feel more light-headed than you have felt before. Your incision starts to open up.  This information is not intended to replace advice given to you by your health care provider. Make sure you discuss any questions you have with your health care provider.

## 2022-06-10 NOTE — Progress Notes (Signed)
Dr Myles Gip read post CXR pt may DC

## 2022-06-10 NOTE — Interval H&P Note (Signed)
History and Physical Interval Note: The attached note contains all the elements of an H&P yet is labeled as a "progress note."  06/10/2022 10:36 AM  [facility]  has presented today for surgery, with the diagnosis of chf.  The various methods of treatment have been discussed with the patient and family. After consideration of risks, benefits and other options for treatment, the patient has consented to  Procedure(s): BIV ICD INSERTION CRT-D (N/A) as a surgical intervention.  The patient's history has been reviewed, patient examined, no change in status, stable for surgery.  I have reviewed the patient's chart and labs.  Questions were answered to the patient's satisfaction.     Yetta Barre Kenzlie Disch

## 2022-06-11 ENCOUNTER — Institutional Professional Consult (permissible substitution): Payer: Medicare Other | Admitting: Cardiovascular Disease

## 2022-06-11 ENCOUNTER — Encounter (HOSPITAL_COMMUNITY): Payer: Self-pay | Admitting: Cardiovascular Disease

## 2022-06-16 ENCOUNTER — Encounter: Payer: Self-pay | Admitting: Cardiovascular Disease

## 2022-06-25 ENCOUNTER — Ambulatory Visit: Payer: Medicare Other | Attending: Physician Assistant | Admitting: Physician Assistant

## 2022-06-25 ENCOUNTER — Ambulatory Visit
Admission: RE | Admit: 2022-06-25 | Discharge: 2022-06-25 | Disposition: A | Discharge status: Home / Self Care | Payer: Medicare Other | Source: Ambulatory Visit | Attending: Physician Assistant | Admitting: Physician Assistant

## 2022-06-25 ENCOUNTER — Encounter: Payer: Self-pay | Admitting: Physician Assistant

## 2022-06-25 ENCOUNTER — Encounter: Payer: Medicare Other | Admitting: Physician Assistant

## 2022-06-25 VITALS — BP 118/72 | HR 88 | Ht 64.0 in | Wt 110.0 lb

## 2022-06-25 DIAGNOSIS — I5022 Chronic systolic (congestive) heart failure: Secondary | ICD-10-CM | POA: Diagnosis not present

## 2022-06-25 DIAGNOSIS — I10 Essential (primary) hypertension: Secondary | ICD-10-CM | POA: Diagnosis not present

## 2022-06-25 DIAGNOSIS — Z95810 Presence of automatic (implantable) cardiac defibrillator: Secondary | ICD-10-CM

## 2022-06-25 DIAGNOSIS — Z950 Presence of cardiac pacemaker: Secondary | ICD-10-CM

## 2022-06-25 DIAGNOSIS — M25519 Pain in unspecified shoulder: Secondary | ICD-10-CM | POA: Insufficient documentation

## 2022-06-25 DIAGNOSIS — I428 Other cardiomyopathies: Secondary | ICD-10-CM | POA: Diagnosis not present

## 2022-06-25 LAB — CUP PACEART INCLINIC DEVICE CHECK
Date Time Interrogation Session: 20231019183518
Lead Channel Pacing Threshold Amplitude: 0.5 V
Lead Channel Pacing Threshold Amplitude: 1.375 V
Lead Channel Pacing Threshold Amplitude: 1.75 V
Lead Channel Pacing Threshold Pulse Width: 0.5 ms
Lead Channel Pacing Threshold Pulse Width: 0.5 ms
Lead Channel Pacing Threshold Pulse Width: 0.5 ms
Lead Channel Sensing Intrinsic Amplitude: 11.3 mV
Lead Channel Sensing Intrinsic Amplitude: 5 mV
Pulse Gen Serial Number: 111063893

## 2022-06-25 NOTE — Progress Notes (Signed)
Cardiology Office Note Date:  06/25/2022  Patient ID:  Nancy Marsh, DOB 04-15-1956, MRN [id number] PCP:  Nicholes Rough, PA-C  Cardiologist:  Dr. Marlou Porch Electrophysiologist: Dr. Myles Gip    Chief Complaint:  wound check  History of Present Illness: Nancy Marsh is a 66 y.o. female with history of LBBB, COPD, smoker, NICM, chronic CHF, HTN  Referred to Dr. Myles Gip for consideration of CRT-D, saw him 06/04/22, felt to be a good candidate for the procedure. Underwent CRT-D implant on 06/10/22.  Device clinic with device alert for high LV thresholds  TODAY She is accompanied by her son (and grandson) today She is feeling quite well. No device site concerns. No CP, palpitations or cardiac awareness. No SOB No near syncope or syncope.   Device information Abbott CRT-D implanted 06/10/22, primary prevention LV lead is  in the CS  Past Medical History:  Diagnosis Date   COPD (chronic obstructive pulmonary disease) (HCC)    Degenerative disc disease, lumbar    Hypertension    Neuropathy    Panic attack     Past Surgical History:  Procedure Laterality Date   BACK SURGERY     BIV ICD INSERTION CRT-D N/A 06/10/2022   Procedure: BIV ICD INSERTION CRT-D;  Surgeon: Melida Quitter, MD;  Location: [facility] CV LAB;  Service: Cardiovascular;  Laterality: N/A;   CHOLECYSTECTOMY     RIGHT/LEFT HEART CATH AND CORONARY ANGIOGRAPHY N/A 01/02/2022   Procedure: RIGHT/LEFT HEART CATH AND CORONARY ANGIOGRAPHY;  Surgeon: Martin[place], Peter M, MD;  Location: [facility] CV LAB;  Service: Cardiovascular;  Laterality: N/A;    Current Outpatient Medications  Medication Sig Dispense Refill   albuterol (VENTOLIN HFA) 108 (90 Base) MCG/ACT inhaler Inhale 1 puff into the lungs every 6 (six) hours as needed for wheezing or shortness of breath. 1 each 1   baclofen (LIORESAL) 10 MG tablet Take 1 tablet (10 mg total) by mouth 3 (three) times daily. (Patient taking differently: Take 10 mg by mouth  daily.) 30 each 1   DULoxetine (CYMBALTA) 30 MG capsule Take 1 capsule (30 mg total) by mouth daily. 30 capsule 0   empagliflozin (JARDIANCE) 10 MG TABS tablet Take 1 tablet (10 mg total) by mouth daily. 90 tablet 3   ezetimibe (ZETIA) 10 MG tablet Take 10 mg by mouth daily.     Fluticasone-Umeclidin-Vilant (TRELEGY ELLIPTA) 200-62.5-25 MCG/ACT AEPB Inhale 1 puff into the lungs daily. 60 each 0   metoprolol succinate (TOPROL-XL) 25 MG 24 hr tablet Take 0.5 tablets (12.5 mg total) by mouth daily. 15 tablet 11   nicotine (NICODERM CQ - DOSED IN MG/24 HOURS) 14 mg/24hr patch Place 1 patch (14 mg total) onto the skin daily. 28 patch 0   oxyCODONE-acetaminophen (PERCOCET/ROXICET) 5-325 MG tablet Take 1 tablet by mouth every 8 (eight) hours as needed for moderate pain or severe pain.     rosuvastatin (CRESTOR) 40 MG tablet Take 1 tablet (40 mg total) by mouth daily. 30 tablet 11   sacubitril-valsartan (ENTRESTO) 24-26 MG Take 1 tablet by mouth 2 (two) times daily. 180 tablet 3   traZODone (DESYREL) 50 MG tablet Take 50 mg by mouth at bedtime as needed for sleep.     No current facility-administered medications for this visit.    Allergies:   Ibuprofen and Nsaids   Social History:  The patient  reports that she has been smoking cigarettes. She has been smoking an average of .5 packs per day. She has never used  smokeless tobacco. She reports that she does not drink alcohol.   Family History:  The patient's family history is not on file.  ROS:  Please see the history of present illness.    All other systems are reviewed and otherwise negative.   PHYSICAL EXAM:  VS:  BP 118/72   Pulse 88   Ht 5\' 4"  (1.626 m)   Wt 110 lb (49.9 kg)   SpO2 94%   BMI 18.88 kg/m  BMI: Body mass index is 18.88 kg/m. Well nourished, well developed, in no acute distress HEENT: normocephalic, atraumatic Neck: no JVD, carotid bruits or masses Cardiac:  RRR; no significant murmurs, no rubs, or gallops Lungs:  CTA  b/l, no wheezing, rhonchi or rales Abd: soft, nontender MS: no deformity or atrophy Ext: no edema Skin: warm and dry, no rash Neuro:  No gross deficits appreciated Psych: euthymic mood, full affect  ICD site is stable, steri strips are removed without difficulty, wound edges are well approcimated, no erythema, edema, heat, fluctuation, no tethering or discomfort   EKG:  done today and reviewed by myself Initial is SR/VPaced QRS 12ms, Neg V1, tall R V1 #2 post programming SR/VPaced 71bpm, morphology much like her post procedure EKG, QRS 139ms   Device interrogation done today and reviewed by myself:  Battery, sensing/impedance measurements are all good RA measurements stable RV 1.375/0.5 (implant was 0.625) LV lead mid 3-prox 4 is 3.5/0.5 (With industry support) LV testing noted phrenic on any pole 1 configuration 3-coil felt closes configuration to initial 1.75/0.5 and programmed this configuration with LV 1st by 59ms This resulted in an EKG much like her post procedure EKG (QRS 151ms from 136)   TTE: 05/14/2022 EF <20%, LV moderately dilated  [facility] 01/02/22   Mid RCA lesion is 35% stenosed.   Ost LAD to Prox LAD lesion is 30% stenosed.   LV end diastolic pressure is mildly elevated.   Hemodynamic findings consistent with mild pulmonary hypertension.   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 PCWP mean 17 mm Hg. LVEDP 20 mm Hg Mild pulmonary HTN. PAP mean 30 mm Hg Reduced cardiac output. Co-ox 60 with Cardiac output 3.25 L/min. Index 2.06.   Plan: medical therapy.   Echo 01/02/22 1. Left ventricular ejection fraction, by estimation, is 20 to 25%. The  left ventricle has severely decreased function. The left ventricle  demonstrates global hypokinesis. There is mild left ventricular  hypertrophy. Left ventricular diastolic parameters   are consistent with Grade I diastolic dysfunction (impaired  relaxation).   2. Right ventricular systolic function is normal. The right ventricular  size is normal.   3. The mitral valve is normal in structure. Mild mitral valve  regurgitation. No evidence of mitral stenosis.   4. The aortic valve is tricuspid. There is mild calcification of the  aortic valve. There is mild thickening of the aortic valve. Aortic valve  regurgitation is not visualized. Aortic valve sclerosis/calcification is  present, without any evidence of  aortic stenosis.   5. The inferior vena cava is normal in size with greater than 50%  respiratory variability, suggesting right atrial pressure of 3 mmHg.   Recent Labs: 01/01/2022: B Natriuretic Peptide 1,863.9; TSH 0.642 01/04/2022: Magnesium 1.9 02/04/2022: ALT 18 06/04/2022: BUN 8; Creatinine, Ser 0.73; Hemoglobin 14.6; Platelets 162; Potassium 3.5; Sodium 141  01/02/2022: Cholesterol 248; HDL 73; LDL Cholesterol 167; Total CHOL/HDL Ratio 3.4; Triglycerides 38; VLDL 8   CrCl cannot  be calculated (Patient's most recent lab result is older than the maximum 21 days allowed.).   Wt Readings from Last 3 Encounters:  06/25/22 110 lb (49.9 kg)  06/10/22 114 lb (51.7 kg)  06/04/22 114 lb (51.7 kg)     Other studies reviewed: Additional studies/records reviewed today include: summarized above  ASSESSMENT AND PLAN:  CRT-D As above Well healed without evidence of infection Activity restrictions were reviewed with her as well as shock plan  Plan CXR and back in 2 weeks  NICM Chronic CHF (systolic) No symptoms or exam findings of volume OL C/w Dr. Jane Canary  HTN Looks good    Disposition: F/u with me in 2 weeks for further optimization if needed.    Current medicines are reviewed at length with the patient today.  The patient did not have any concerns regarding medicines.  Venetia Night, PA-C 06/25/2022 4:13 PM     [place] [address] [id number] [phone]  (office)  [phone] (fax)

## 2022-06-25 NOTE — Patient Instructions (Addendum)
Medication Instructions:   Your physician recommends that you continue on your current medications as directed. Please refer to the Current Medication list given to you today.  *If you need a refill on your cardiac medications before your next appointment, please call your pharmacy*   Lab Work: [place]   If you have labs (blood work) drawn today and your tests are completely normal, you will receive your results only by: [place] (if you have MyChart) OR A paper copy in the mail If you have any lab test that is abnormal or we need to change your treatment, we will call you to review the results.   Testing/Procedures: A chest x-ray takes a picture of the organs and structures inside the chest, including the heart, lungs, and blood vessels. This test can show several things, including, whether the heart is enlarges; whether fluid is building up in the lungs; and whether pacemaker / defibrillator leads are still in place.  Located in: [facility] Address: [address], [place], [place] [address] Areas served: [place] and nearby areas Hours:  Tuesday 8AM-5:30PM Wednesday 8AM-5:30PM Thursday 8AM-5:30PM Friday 8AM-5:30PM Saturday Closed Sunday Closed Monday 8AM-5:30PM Phone: [phone]      Follow-Up: At [facility], you and your health needs are our priority.  As part of our continuing mission to provide you with exceptional heart care, we have created designated Provider Care Teams.  These Care Teams include your primary Cardiologist (physician) and Advanced Practice Providers (APPs -  Physician Assistants and Nurse Practitioners) who all work together to provide you with the care you need, when you need it.  We recommend signing up for the patient portal called "MyChart".  Sign up information is provided on this After Visit Summary.  MyChart is used to connect with patients for Virtual Visits (Telemedicine).  Patients are  able to view lab/test results, encounter notes, upcoming appointments, etc.  Non-urgent messages can be sent to your provider as well.   To learn more about what you can do with MyChart, go to [email or web address].    Your next appointment:  IN 2 WEEKS    The format for your next appointment:   In Person  Provider:  Tommye Standard PA-C    Other Instructions   Important Information About Sugar

## 2022-07-07 NOTE — Progress Notes (Unsigned)
Cardiology Office Note Date:  07/07/2022  Patient ID:  Nancy Marsh, DOB July 26, 1956, MRN [id number] PCP:  Ladora Daniel, PA-C  Cardiologist:  Dr. Anne Fu Electrophysiologist: Dr. Nelly Laurence    Chief Complaint:  wound check  History of Present Illness: Nancy Marsh is a 66 y.o. female with history of LBBB, COPD, smoker, NICM, chronic CHF, HTN  Referred to Dr. Nelly Laurence for consideration of CRT-D, saw him 06/04/22, felt to be a good candidate for the procedure. Underwent CRT-D implant on 06/10/22.  Device clinic with device alert for high LV thresholds  I saw her 06/25/22 She is accompanied by her son (and grandson) today She is feeling quite well. No device site concerns. No CP, palpitations or cardiac awareness. No SOB No near syncope or syncope. Wound was well healed, no signs of infection LV thresholds up significantly, RV up slightly LV lead mid 3-prox 4 is 3.5/0.5 (With industry support) LV testing noted phrenic on any pole 1 configuration 3-coil felt closes configuration to initial 1.75/0.5 and programmed this configuration with LV 1st by 60ms This resulted in an EKG much like her post procedure EKG (QRS from 136)  Planned for CXR and return in a couple weeks.  CXR reviewed by Dr. Nelly Laurence with stable lead position.  *** symptoms *** EKG *** volume  Device information Abbott CRT-D implanted 06/10/22, primary prevention LV lead is  in the CS  Past Medical History:  Diagnosis Date   COPD (chronic obstructive pulmonary disease) (HCC)    Degenerative disc disease, lumbar    Hypertension    Neuropathy    Panic attack     Past Surgical History:  Procedure Laterality Date   BACK SURGERY     BIV ICD INSERTION CRT-D N/A 06/10/2022   Procedure: BIV ICD INSERTION CRT-D;  Surgeon: Maurice Small, MD;  Location: [facility] INVASIVE CV LAB;  Service: Cardiovascular;  Laterality: N/A;   CHOLECYSTECTOMY     RIGHT/LEFT HEART CATH AND CORONARY ANGIOGRAPHY N/A 01/02/2022    Procedure: RIGHT/LEFT HEART CATH AND CORONARY ANGIOGRAPHY;  Surgeon: Swaziland, Peter M, MD;  Location: [facility] INVASIVE CV LAB;  Service: Cardiovascular;  Laterality: N/A;    Current Outpatient Medications  Medication Sig Dispense Refill   albuterol (VENTOLIN HFA) 108 (90 Base) MCG/ACT inhaler Inhale 1 puff into the lungs every 6 (six) hours as needed for wheezing or shortness of breath. 1 each 1   baclofen (LIORESAL) 10 MG tablet Take 1 tablet (10 mg total) by mouth 3 (three) times daily. (Patient taking differently: Take 10 mg by mouth daily.) 30 each 1   DULoxetine (CYMBALTA) 30 MG capsule Take 1 capsule (30 mg total) by mouth daily. 30 capsule 0   empagliflozin (JARDIANCE) 10 MG TABS tablet Take 1 tablet (10 mg total) by mouth daily. 90 tablet 3   ezetimibe (ZETIA) 10 MG tablet Take 10 mg by mouth daily.     Fluticasone-Umeclidin-Vilant (TRELEGY ELLIPTA) 200-62.5-25 MCG/ACT AEPB Inhale 1 puff into the lungs daily. 60 each 0   metoprolol succinate (TOPROL-XL) 25 MG 24 hr tablet Take 0.5 tablets (12.5 mg total) by mouth daily. 15 tablet 11   nicotine (NICODERM CQ - DOSED IN MG/24 HOURS) 14 mg/24hr patch Place 1 patch (14 mg total) onto the skin daily. 28 patch 0   oxyCODONE-acetaminophen (PERCOCET/ROXICET) 5-325 MG tablet Take 1 tablet by mouth every 8 (eight) hours as needed for moderate pain or severe pain.     rosuvastatin (CRESTOR) 40 MG tablet Take 1 tablet (40  mg total) by mouth daily. 30 tablet 11   sacubitril-valsartan (ENTRESTO) 24-26 MG Take 1 tablet by mouth 2 (two) times daily. 180 tablet 3   traZODone (DESYREL) 50 MG tablet Take 50 mg by mouth at bedtime as needed for sleep.     No current facility-administered medications for this visit.    Allergies:   Ibuprofen and Nsaids   Social History:  The patient  reports that she has been smoking cigarettes. She has been smoking an average of .5 packs per day. She has never used smokeless tobacco. She reports that she does not drink  alcohol.   Family History:  The patient's family history is not on file.  ROS:  Please see the history of present illness.    All other systems are reviewed and otherwise negative.   PHYSICAL EXAM:  VS:  There were no vitals taken for this visit. BMI: There is no height or weight on file to calculate BMI. Well nourished, well developed, in no acute distress HEENT: normocephalic, atraumatic Neck: no JVD, carotid bruits or masses Cardiac:  *** RRR; no significant murmurs, no rubs, or gallops Lungs:  *** CTA b/l, no wheezing, rhonchi or rales Abd: soft, nontender MS: no deformity or atrophy Ext: *** no edema Skin: warm and dry, no rash Neuro:  No gross deficits appreciated Psych: euthymic mood, full affect  *** ICD site is stable, ***   EKG:  done today and reviewed by myself ***   Device interrogation done today and reviewed by myself:  ***  TTE: 05/14/2022 EF <20%, LV moderately dilated  [facility] 01/02/22   Mid RCA lesion is 35% stenosed.   Ost LAD to Prox LAD lesion is 30% stenosed.   LV end diastolic pressure is mildly elevated.   Hemodynamic findings consistent with mild pulmonary hypertension.   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 PCWP mean 17 mm Hg. LVEDP 20 mm Hg Mild pulmonary HTN. PAP mean 30 mm Hg Reduced cardiac output. Co-ox 60 with Cardiac output 3.25 L/min. Index 2.06.   Plan: medical therapy.   Echo 01/02/22 1. Left ventricular ejection fraction, by estimation, is 20 to 25%. The  left ventricle has severely decreased function. The left ventricle  demonstrates global hypokinesis. There is mild left ventricular  hypertrophy. Left ventricular diastolic parameters   are consistent with Grade I diastolic dysfunction (impaired relaxation).   2. Right ventricular systolic function is normal. The right ventricular  size is normal.   3. The mitral valve is normal in structure. Mild  mitral valve  regurgitation. No evidence of mitral stenosis.   4. The aortic valve is tricuspid. There is mild calcification of the  aortic valve. There is mild thickening of the aortic valve. Aortic valve  regurgitation is not visualized. Aortic valve sclerosis/calcification is  present, without any evidence of  aortic stenosis.   5. The inferior vena cava is normal in size with greater than 50%  respiratory variability, suggesting right atrial pressure of 3 mmHg.   Recent Labs: 01/01/2022: B Natriuretic Peptide 1,863.9; TSH 0.642 01/04/2022: Magnesium 1.9 02/04/2022: ALT 18 06/04/2022: BUN 8; Creatinine, Ser 0.73; Hemoglobin 14.6; Platelets 162; Potassium 3.5; Sodium 141  01/02/2022: Cholesterol 248; HDL 73; LDL Cholesterol 167; Total CHOL/HDL Ratio 3.4; Triglycerides 38; VLDL 8   CrCl cannot be calculated (Patient's most recent lab result is older than the maximum 21 days allowed.).   Wt Readings from Last 3 Encounters:  06/25/22  110 lb (49.9 kg)  06/10/22 114 lb (51.7 kg)  06/04/22 114 lb (51.7 kg)     Other studies reviewed: Additional studies/records reviewed today include: summarized above  ASSESSMENT AND PLAN:  CRT-D ***   Chronic CHF (systolic) *** No symptoms or exam findings of volume OL C/w Dr. Lalla Brothers  HTN *** Looks good    Disposition: ***    Current medicines are reviewed at length with the patient today.  The patient did not have any concerns regarding medicines.  Norma Fredrickson, PA-C 07/07/2022 7:40 AM     [facility] [address] [place] [address] [phone] (office)  [phone] (fax)

## 2022-07-09 ENCOUNTER — Ambulatory Visit: Payer: Medicare Other | Attending: Physician Assistant | Admitting: Physician Assistant

## 2022-07-09 ENCOUNTER — Encounter: Payer: Self-pay | Admitting: Physician Assistant

## 2022-07-09 VITALS — BP 104/68 | HR 82 | Ht 64.0 in | Wt 108.6 lb

## 2022-07-09 DIAGNOSIS — I447 Left bundle-branch block, unspecified: Secondary | ICD-10-CM | POA: Diagnosis not present

## 2022-07-09 DIAGNOSIS — I5022 Chronic systolic (congestive) heart failure: Secondary | ICD-10-CM

## 2022-07-09 DIAGNOSIS — I428 Other cardiomyopathies: Secondary | ICD-10-CM

## 2022-07-09 DIAGNOSIS — Z95810 Presence of automatic (implantable) cardiac defibrillator: Secondary | ICD-10-CM

## 2022-07-09 LAB — CUP PACEART INCLINIC DEVICE CHECK
Date Time Interrogation Session: 20231102131103
Lead Channel Pacing Threshold Amplitude: 0.5 V
Lead Channel Pacing Threshold Amplitude: 1 V
Lead Channel Pacing Threshold Amplitude: 1.875 V
Lead Channel Pacing Threshold Pulse Width: 0.5 ms
Lead Channel Pacing Threshold Pulse Width: 0.5 ms
Lead Channel Pacing Threshold Pulse Width: 0.5 ms
Lead Channel Sensing Intrinsic Amplitude: 12 mV
Lead Channel Sensing Intrinsic Amplitude: 5 mV
Pulse Gen Serial Number: 111063893

## 2022-07-09 NOTE — Patient Instructions (Signed)
Medication Instructions:   Your physician recommends that you continue on your current medications as directed. Please refer to the Current Medication list given to you today.   *If you need a refill on your cardiac medications before your next appointment, please call your pharmacy*   Lab Work: NONE ORDERED  TODAY    If you have labs (blood work) drawn today and your tests are completely normal, you will receive your results only by: MyChart Message (if you have MyChart) OR A paper copy in the mail If you have any lab test that is abnormal or we need to change your treatment, we will call you to review the results.   Testing/Procedures: NONE ORDERED  TODAY    Follow-Up: At [facility], you and your health needs are our priority.  As part of our continuing mission to provide you with exceptional heart care, we have created designated Provider Care Teams.  These Care Teams include your primary Cardiologist (physician) and Advanced Practice Providers (APPs -  Physician Assistants and Nurse Practitioners) who all work together to provide you with the care you need, when you need it.  We recommend signing up for the patient portal called "MyChart".  Sign up information is provided on this After Visit Summary.  MyChart is used to connect with patients for Virtual Visits (Telemedicine).  Patients are able to view lab/test results, encounter notes, upcoming appointments, etc.  Non-urgent messages can be sent to your provider as well.   To learn more about what you can do with MyChart, go to [email or web address].    Your next appointment:   2 month(s)  The format for your next appointment:   In Person  Provider:   Augustus Mealor, MD   Other Instructions   Important Information About Sugar       

## 2022-08-13 ENCOUNTER — Encounter: Payer: Self-pay | Admitting: Cardiology

## 2022-08-13 ENCOUNTER — Ambulatory Visit: Payer: Medicare Other | Attending: Cardiology | Admitting: Cardiology

## 2022-08-13 ENCOUNTER — Telehealth: Payer: Self-pay | Admitting: *Deleted

## 2022-08-13 VITALS — BP 120/80 | HR 74 | Ht 64.0 in | Wt 109.0 lb

## 2022-08-13 DIAGNOSIS — I5022 Chronic systolic (congestive) heart failure: Secondary | ICD-10-CM

## 2022-08-13 DIAGNOSIS — I447 Left bundle-branch block, unspecified: Secondary | ICD-10-CM

## 2022-08-13 DIAGNOSIS — I428 Other cardiomyopathies: Secondary | ICD-10-CM

## 2022-08-13 DIAGNOSIS — Z95810 Presence of automatic (implantable) cardiac defibrillator: Secondary | ICD-10-CM

## 2022-08-13 NOTE — Progress Notes (Signed)
Cardiology Office Note:    Date:  08/13/2022   ID:  Nancy Marsh, DOB 04/11/1956, MRN [id number]  PCP:  Ladora Daniel, PA-C   [facility] Providers Cardiologist:  Donato Schultz, MD Electrophysiologist:  Maurice Small, MD     Referring MD: Ladora Daniel, PA-C    History of Present Illness:    Nancy Marsh is a 66 y.o. female here for left bundle branch block nonischemic cardiomyopathy COPD smoker.  Underwent CRT-D implant on 06/10/2022.  Feeling well no chest pain no shortness of breath no syncope.  Reviewed notes from EP.  Past Medical History:  Diagnosis Date   COPD (chronic obstructive pulmonary disease) (HCC)    Degenerative disc disease, lumbar    Hypertension    Neuropathy    Panic attack     Past Surgical History:  Procedure Laterality Date   BACK SURGERY     BIV ICD INSERTION CRT-D N/A 06/10/2022   Procedure: BIV ICD INSERTION CRT-D;  Surgeon: Mealor, Roberts Gaudy, MD;  Location: [facility] INVASIVE CV LAB;  Service: Cardiovascular;  Laterality: N/A;   CHOLECYSTECTOMY     RIGHT/LEFT HEART CATH AND CORONARY ANGIOGRAPHY N/A 01/02/2022   Procedure: RIGHT/LEFT HEART CATH AND CORONARY ANGIOGRAPHY;  Surgeon: Swaziland, Peter M, MD;  Location: [facility] INVASIVE CV LAB;  Service: Cardiovascular;  Laterality: N/A;    Current Medications: Current Meds  Medication Sig   albuterol (VENTOLIN HFA) 108 (90 Base) MCG/ACT inhaler Inhale 1 puff into the lungs every 6 (six) hours as needed for wheezing or shortness of breath.   baclofen (LIORESAL) 10 MG tablet Take 1 tablet (10 mg total) by mouth 3 (three) times daily.   DULoxetine (CYMBALTA) 30 MG capsule Take 1 capsule (30 mg total) by mouth daily.   empagliflozin (JARDIANCE) 10 MG TABS tablet Take 1 tablet (10 mg total) by mouth daily.   ezetimibe (ZETIA) 10 MG tablet Take 10 mg by mouth daily.   Fluticasone-Umeclidin-Vilant (TRELEGY ELLIPTA) 200-62.5-25 MCG/ACT AEPB Inhale 1 puff into the lungs daily.   metoprolol succinate  (TOPROL-XL) 25 MG 24 hr tablet Take 0.5 tablets (12.5 mg total) by mouth daily.   nicotine (NICODERM CQ - DOSED IN MG/24 HOURS) 14 mg/24hr patch Place 1 patch (14 mg total) onto the skin daily.   oxyCODONE-acetaminophen (PERCOCET/ROXICET) 5-325 MG tablet Take 1 tablet by mouth every 8 (eight) hours as needed for moderate pain or severe pain.   rosuvastatin (CRESTOR) 40 MG tablet Take 1 tablet (40 mg total) by mouth daily.   sacubitril-valsartan (ENTRESTO) 24-26 MG Take 1 tablet by mouth 2 (two) times daily.   traZODone (DESYREL) 50 MG tablet Take 50 mg by mouth at bedtime as needed for sleep.     Allergies:   Ibuprofen and Nsaids   Social History   Socioeconomic History   Marital status: Legally Separated    Spouse name: Not on file   Number of children: Not on file   Years of education: Not on file   Highest education level: Not on file  Occupational History   Not on file  Tobacco Use   Smoking status: Every Day    Packs/day: 0.50    Types: Cigarettes   Smokeless tobacco: Never  Substance and Sexual Activity   Alcohol use: No   Drug use: Not on file   Sexual activity: Not on file  Other Topics Concern   Not on file  Social History Narrative   Not on file   Social Determinants of Health  Financial Resource Strain: Not on file  Food Insecurity: Not on file  Transportation Needs: Not on file  Physical Activity: Not on file  Stress: Not on file  Social Connections: Not on file     Family History: No early CAD  ROS:   Please see the history of present illness.    Denies any orthopnea or PND.  All other systems reviewed and are negative.  EKGs/Labs/Other Studies Reviewed:    The following studies were reviewed today: TTE: 05/14/2022 EF <20%, LV moderately dilated   [facility] 01/02/22   Mid RCA lesion is 35% stenosed.   Ost LAD to Prox LAD lesion is 30% stenosed.   LV end diastolic pressure is mildly elevated.   Hemodynamic findings consistent with mild pulmonary  hypertension.   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 PCWP mean 17 mm Hg. LVEDP 20 mm Hg Mild pulmonary HTN. PAP mean 30 mm Hg Reduced cardiac output. Co-ox 60 with Cardiac output 3.25 L/min. Index 2.06.   Plan: medical therapy.    EKG: Prior EKG showed left bundle branch block  Recent Labs: 01/01/2022: B Natriuretic Peptide 1,863.9; TSH 0.642 01/04/2022: Magnesium 1.9 02/04/2022: ALT 18 06/04/2022: BUN 8; Creatinine, Ser 0.73; Hemoglobin 14.6; Platelets 162; Potassium 3.5; Sodium 141  Recent Lipid Panel    Component Value Date/Time   CHOL 248 (H) 01/02/2022 0310   TRIG 38 01/02/2022 0310   HDL 73 01/02/2022 0310   CHOLHDL 3.4 01/02/2022 0310   VLDL 8 01/02/2022 0310   LDLCALC 167 (H) 01/02/2022 0310     Risk Assessment/Calculations:               Physical Exam:    VS:  BP 120/80 (BP Location: Left Arm, Patient Position: Sitting, Cuff Size: Normal)   Pulse 74   Ht 5\' 4"  (1.626 m)   Wt 109 lb (49.4 kg)   SpO2 96%   BMI 18.71 kg/m     Wt Readings from Last 3 Encounters:  08/13/22 109 lb (49.4 kg)  07/09/22 108 lb 9.6 oz (49.3 kg)  06/25/22 110 lb (49.9 kg)     GEN: Thin in no acute distress HEENT: Normal NECK: No JVD; No carotid bruits LYMPHATICS: No lymphadenopathy CARDIAC: RRR, no murmurs, rubs, gallops, ICD pocket well-healed. RESPIRATORY:  Clear to auscultation without rales, wheezing or rhonchi  ABDOMEN: Soft, non-tender, non-distended MUSCULOSKELETAL:  No edema; No deformity  SKIN: Warm and dry NEUROLOGIC:  Alert and oriented x 3 PSYCHIATRIC:  Normal affect   ASSESSMENT:    1. Chronic systolic congestive heart failure (HCC)   2. LBBB (left bundle branch block)   3. Biventricular implantable cardioverter-defibrillator in situ   4. NICM (nonischemic cardiomyopathy) (HCC)    PLAN:    In order of problems listed above:  Acute HFrEF-EF 20 to 25% -Nonischemic etiology.   Minimal CAD noted on heart catheterization. - Goal-directed medical therapy - Jardiance 10 mg once a day - Entresto 24/26 mg twice a day -Toprol XL 12.5 mg once a day very low-dose. -CRT D therapy. -She brought 06/27/22 [employer] forms to help with Entresto.  We will fill out.  Left bundle branch block - Chronic, CRT-D therapy.  I believe with resynchronization, pump function will improve.  She is feeling well.  NYHA class I-II   COPD - Per primary team   Hyperlipidemia - Continue with rosuvastatin 40 LDL goal less than 70.  In the future we will recheck.  Elevated troponin - Myocardial injury secondary to heart failure exacerbation.  Currently stable.  Cardiac catheterization reviewed as above.   71-month follow-up with APP.         Medication Adjustments/Labs and Tests Ordered: Current medicines are reviewed at length with the patient today.  Concerns regarding medicines are outlined above.  No orders of the defined types were placed in this encounter.  No orders of the defined types were placed in this encounter.   Patient Instructions  Medication Instructions:  The current medical regimen is effective;  continue present plan and medications.  *If you need a refill on your cardiac medications before your next appointment, please call your pharmacy*  Follow-Up: At [facility], you and your health needs are our priority.  As part of our continuing mission to provide you with exceptional heart care, we have created designated Provider Care Teams.  These Care Teams include your primary Cardiologist (physician) and Advanced Practice Providers (APPs -  Physician Assistants and Nurse Practitioners) who all work together to provide you with the care you need, when you need it.  We recommend signing up for the patient portal called "MyChart".  Sign up information is provided on this After Visit Summary.  MyChart is used to connect with patients for Virtual Visits  (Telemedicine).  Patients are able to view lab/test results, encounter notes, upcoming appointments, etc.  Non-urgent messages can be sent to your provider as well.   To learn more about what you can do with MyChart, go to [email or web address].    Your next appointment:   4 month(s)  The format for your next appointment:   In Person  Provider:   With any NP or PA   And 1 year with Donato Schultz, MD     Important Information About Sugar         Signed, Donato Schultz, MD  08/13/2022 12:07 PM    [facility]

## 2022-08-13 NOTE — Telephone Encounter (Signed)
Pt brought pt assistance application into office during her appt today with Dr Anne Fu.  Application completed, and faxed to [phone] along with prescriber application, pt SS admin Benefit verification Letter, copy of her insurance cards and medication/allergy list.  Confirmation of successful fax received.

## 2022-08-13 NOTE — Patient Instructions (Signed)
Medication Instructions:  The current medical regimen is effective;  continue present plan and medications.  *If you need a refill on your cardiac medications before your next appointment, please call your pharmacy*  Follow-Up: At [facility], you and your health needs are our priority.  As part of our continuing mission to provide you with exceptional heart care, we have created designated Provider Care Teams.  These Care Teams include your primary Cardiologist (physician) and Advanced Practice Providers (APPs -  Physician Assistants and Nurse Practitioners) who all work together to provide you with the care you need, when you need it.  We recommend signing up for the patient portal called "MyChart".  Sign up information is provided on this After Visit Summary.  MyChart is used to connect with patients for Virtual Visits (Telemedicine).  Patients are able to view lab/test results, encounter notes, upcoming appointments, etc.  Non-urgent messages can be sent to your provider as well.   To learn more about what you can do with MyChart, go to [email or web address].    Your next appointment:   4 month(s)  The format for your next appointment:   In Person  Provider:   With any NP or PA   And 1 year with Donato Schultz, MD     Important Information About Sugar

## 2022-08-21 NOTE — Telephone Encounter (Signed)
PATIENT WAS APPROVED FOR ENTRESTO UNTIL 09/07/23, PHONE NUMBER [phone]

## 2022-09-14 ENCOUNTER — Ambulatory Visit: Payer: Medicare Other | Admitting: Cardiovascular Disease

## 2022-09-16 ENCOUNTER — Ambulatory Visit (INDEPENDENT_AMBULATORY_CARE_PROVIDER_SITE_OTHER): Payer: Medicare Other

## 2022-09-16 DIAGNOSIS — I428 Other cardiomyopathies: Secondary | ICD-10-CM

## 2022-09-16 LAB — CUP PACEART REMOTE DEVICE CHECK
Battery Remaining Longevity: 86 mo
Battery Remaining Percentage: 93 %
Battery Voltage: 3.01 V
Brady Statistic AP VP Percent: 1 %
Brady Statistic AP VS Percent: 1 %
Brady Statistic AS VP Percent: 97 %
Brady Statistic AS VS Percent: 1.7 %
Brady Statistic RA Percent Paced: 1 %
Date Time Interrogation Session: 20240110102120
HighPow Impedance: 72 Ohm
Lead Channel Impedance Value: 440 Ohm
Lead Channel Impedance Value: 490 Ohm
Lead Channel Impedance Value: 530 Ohm
Lead Channel Pacing Threshold Amplitude: 0.5 V
Lead Channel Pacing Threshold Amplitude: 1 V
Lead Channel Pacing Threshold Amplitude: 2 V
Lead Channel Pacing Threshold Pulse Width: 0.5 ms
Lead Channel Pacing Threshold Pulse Width: 0.5 ms
Lead Channel Pacing Threshold Pulse Width: 0.5 ms
Lead Channel Sensing Intrinsic Amplitude: 3.4 mV
Lead Channel Sensing Intrinsic Amplitude: 5 mV
Lead Channel Setting Pacing Amplitude: 1.5 V
Lead Channel Setting Pacing Amplitude: 2 V
Lead Channel Setting Pacing Amplitude: 2.5 V
Lead Channel Setting Pacing Pulse Width: 0.5 ms
Lead Channel Setting Pacing Pulse Width: 0.5 ms
Lead Channel Setting Sensing Sensitivity: 0.5 mV
Pulse Gen Serial Number: 111063893
Zone Setting Status: 755011

## 2022-09-30 NOTE — Progress Notes (Deleted)
    Electrophysiology Office Note Date: 09/30/2022  ID:  Tangi Shroff, DOB Nov 05, 1955, MRN [id number]  PCP: Nicholes Rough, PA-C Primary Cardiologist: Candee Furbish, MD Electrophysiologist: Melida Quitter, MD   CC: Routine ICD follow-up  Louana Fontenot is a 67 y.o. female seen today for Melida Quitter, MD for routine electrophysiology followup. Since last being seen in our clinic the patient reports doing ***.  she denies chest pain, palpitations, dyspnea, PND, orthopnea, nausea, vomiting, dizziness, syncope, edema, weight gain, or early satiety.   {He/she (caps):30048} has not had ICD shocks.   Device History: Abbott CRT-D implanted 06/10/22, primary prevention LV lead is  in the CS  Past Medical History:  Diagnosis Date   COPD (chronic obstructive pulmonary disease) (HCC)    Degenerative disc disease, lumbar    Hypertension    Neuropathy    Panic attack     Current Outpatient Medications  Medication Instructions   albuterol (VENTOLIN HFA) 108 (90 Base) MCG/ACT inhaler 1 puff, Inhalation, Every 6 hours PRN   baclofen (LIORESAL) 10 mg, Oral, 3 times daily   DULoxetine (CYMBALTA) 30 mg, Oral, Daily   empagliflozin (JARDIANCE) 10 mg, Oral, Daily   ezetimibe (ZETIA) 10 mg, Oral, Daily   Fluticasone-Umeclidin-Vilant (TRELEGY ELLIPTA) 200-62.5-25 MCG/ACT AEPB 1 puff, Inhalation, Daily   metoprolol succinate (TOPROL-XL) 12.5 mg, Oral, Daily   nicotine (NICODERM CQ - DOSED IN MG/24 HOURS) 14 mg, Transdermal, Daily   oxyCODONE-acetaminophen (PERCOCET/ROXICET) 5-325 MG tablet 1 tablet, Oral, Every 8 hours PRN   rosuvastatin (CRESTOR) 40 mg, Oral, Daily   sacubitril-valsartan (ENTRESTO) 24-26 MG 1 tablet, Oral, 2 times daily   traZODone (DESYREL) 50 mg, Oral, At bedtime PRN   Physical Exam: There were no vitals filed for this visit.   GEN- NAD. A&O x 3. Normal affect. HEENT: Normocephalic, atraumatic Lungs- CTAB, Normal effort.  Heart- {EPRHYTHM:28826} rate and rhythm. No  M/G/R.  Extremities- {EDEMA LEVEL:28147::"No"} peripheral edema. no clubbing or cyanosis Skin- warm and dry, no rash or lesion; ICD pocket well healed  ICD interrogation- reviewed in detail today,  See PACEART report  {EKGtoday:28818::"EKG is not ordered today"}  Other studies Reviewed: Additional studies/ records that were reviewed today include: Previous EP office notes.   {Select studies to display:26339}   Assessment and Plan:  1.  Chronic systolic dysfunction s/p Abbott CRT-D  euvolemic today Stable on an appropriate medical regimen Normal ICD function See Pace Art report No changes today  2. HTN Stable on current regimen    Current medicines are reviewed at length with the patient today.   =  Labs/ tests ordered today include: *** No orders of the defined types were placed in this encounter.    Disposition:   Follow up with {EPPROVIDERS:28135} {EPFOLLOW UP:28173}   Jacalyn Lefevre, PA-C  09/30/2022 8:27 AM  [facility] [address] [place] [address] [phone] (office) [phone] (fax)

## 2022-10-01 ENCOUNTER — Ambulatory Visit: Payer: Medicare Other | Admitting: Student

## 2022-10-08 NOTE — Progress Notes (Signed)
Remote ICD transmission.   

## 2022-10-12 NOTE — Progress Notes (Signed)
    Electrophysiology Office Note Date: 10/16/2022  ID:  Nancy Marsh, DOB 1956-01-06, MRN [id number]  PCP: Nicholes Rough, PA-C Primary Cardiologist: Candee Furbish, MD Electrophysiologist: Melida Quitter, MD   CC: Routine ICD follow-up  Nancy Marsh is a 67 y.o. female seen today for Melida Quitter, MD for routine electrophysiology followup. Since last being seen in our clinic the patient reports doing very well.  she denies chest pain, palpitations, dyspnea, PND, orthopnea, nausea, vomiting, dizziness, syncope, edema, weight gain, or early satiety.   She has not had ICD shocks.   Device History: Abbott CRT-D implanted 06/10/22, primary prevention LV lead is  in the CS  Past Medical History:  Diagnosis Date   COPD (chronic obstructive pulmonary disease) (HCC)    Degenerative disc disease, lumbar    Hypertension    Neuropathy    Panic attack     Current Outpatient Medications  Medication Instructions   albuterol (VENTOLIN HFA) 108 (90 Base) MCG/ACT inhaler 1 puff, Inhalation, Every 6 hours PRN   baclofen (LIORESAL) 10 mg, Oral, 3 times daily   DULoxetine (CYMBALTA) 30 mg, Oral, Daily   empagliflozin (JARDIANCE) 10 mg, Oral, Daily   ezetimibe (ZETIA) 10 mg, Oral, Daily   Fluticasone-Umeclidin-Vilant (TRELEGY ELLIPTA) 200-62.5-25 MCG/ACT AEPB 1 puff, Inhalation, Daily   metoprolol succinate (TOPROL-XL) 12.5 mg, Oral, Daily   nicotine (NICODERM CQ - DOSED IN MG/24 HOURS) 14 mg, Transdermal, Daily   oxyCODONE-acetaminophen (PERCOCET/ROXICET) 5-325 MG tablet 1 tablet, Oral, Every 8 hours PRN   rosuvastatin (CRESTOR) 40 mg, Oral, Daily   sacubitril-valsartan (ENTRESTO) 24-26 MG 1 tablet, Oral, 2 times daily   traZODone (DESYREL) 50 mg, Oral, At bedtime PRN   Physical Exam: Vitals:   10/16/22 1030 10/16/22 1044  BP: (!) 141/83   Pulse:  78  SpO2: 99%   Weight: 108 lb (49 kg)   Height: 5\' 4"  (1.626 m)      GEN- NAD. A&O x 3. Normal affect. HEENT: Normocephalic,  atraumatic Lungs- CTAB, Normal effort.  Heart- Regular rate and rhythm rate and rhythm. No M/G/R.  Extremities- No peripheral edema. no clubbing or cyanosis Skin- warm and dry, no rash or lesion; ICD pocket well healed  ICD interrogation- reviewed in detail today,  See PACEART report  EKG is ordered today. Personal review shows V pacing, QRS 144 ms, upright in V1, initial isoelectric vs downward deflection in Lead 1  Other studies Reviewed: Additional studies/ records that were reviewed today include: Previous EP office notes.   Assessment and Plan:  1.  Chronic systolic dysfunction s/p Abbott CRT-D  euvolemic today Stable on an appropriate medical regimen Normal ICD function See Pace Art report No changes today  2. HTN Stable on current regimen    Current medicines are reviewed at length with the patient today.    Disposition:   Follow up with Dr. Myles Gip in 6 months   Signed, Shirley Friar, PA-C  10/16/2022 10:53 AM  [facility] [address] [place] [address] [phone] (office) [phone] (fax)

## 2022-10-16 ENCOUNTER — Ambulatory Visit: Payer: Medicare Other | Attending: Cardiovascular Disease | Admitting: Student

## 2022-10-16 ENCOUNTER — Encounter: Payer: Self-pay | Admitting: Student

## 2022-10-16 VITALS — BP 141/83 | HR 78 | Ht 64.0 in | Wt 108.0 lb

## 2022-10-16 DIAGNOSIS — I10 Essential (primary) hypertension: Secondary | ICD-10-CM | POA: Diagnosis not present

## 2022-10-16 DIAGNOSIS — I5022 Chronic systolic (congestive) heart failure: Secondary | ICD-10-CM | POA: Diagnosis not present

## 2022-10-16 LAB — CUP PACEART INCLINIC DEVICE CHECK
Battery Remaining Longevity: 84 mo
Brady Statistic RA Percent Paced: 0.56 %
Brady Statistic RV Percent Paced: 98 %
Date Time Interrogation Session: 20240209114503
HighPow Impedance: 70.875
Lead Channel Impedance Value: 437.5 Ohm
Lead Channel Impedance Value: 487.5 Ohm
Lead Channel Impedance Value: 525 Ohm
Lead Channel Pacing Threshold Amplitude: 0.5 V
Lead Channel Pacing Threshold Amplitude: 0.875 V
Lead Channel Pacing Threshold Amplitude: 1.5 V
Lead Channel Pacing Threshold Pulse Width: 0.5 ms
Lead Channel Pacing Threshold Pulse Width: 0.5 ms
Lead Channel Pacing Threshold Pulse Width: 0.5 ms
Lead Channel Sensing Intrinsic Amplitude: 12 mV
Lead Channel Sensing Intrinsic Amplitude: 4.8 mV
Lead Channel Setting Pacing Amplitude: 1.5 V
Lead Channel Setting Pacing Amplitude: 1.875
Lead Channel Setting Pacing Amplitude: 2 V
Lead Channel Setting Pacing Pulse Width: 0.5 ms
Lead Channel Setting Pacing Pulse Width: 0.5 ms
Lead Channel Setting Sensing Sensitivity: 0.5 mV
Pulse Gen Serial Number: 111063893
Zone Setting Status: 755011

## 2022-10-16 NOTE — Patient Instructions (Addendum)
Medication Instructions:  Your physician recommends that you continue on your current medications as directed. Please refer to the Current Medication list given to you today.  *If you need a refill on your cardiac medications before your next appointment, please call your pharmacy*  Lab Work: None ordered  Testing/Procedures: None ordered  Follow-Up: At [facility], you and your health needs are our priority.  As part of our continuing mission to provide you with exceptional heart care, we have created designated Provider Care Teams.  These Care Teams include your primary Cardiologist (physician) and Advanced Practice Providers (APPs -  Physician Assistants and Nurse Practitioners) who all work together to provide you with the care you need, when you need it.  Your next appointment:   6 month(s)  The format for your next appointment:   In Person  Provider:   Doralee Albino, MD{

## 2022-12-12 NOTE — Progress Notes (Unsigned)
Cardiology Office Note:    Date:  12/14/2022   ID:  Nancy Marsh, DOB 1955-10-19, MRN [id number]  PCP:  Ladora Daniel, PA-C   [facility] Providers Cardiologist:  Donato Schultz, MD Electrophysiologist:  Maurice Small, MD     Referring MD: Ladora Daniel, PA-C   Chief Complaint: Hospital follow-up CHF  History of Present Illness:    Nancy Marsh is a very pleasant 67 y.o. female with a hx of tobacco abuse, COPD, nonobstructive CAD, LBBB, and hyperlipidemia.   She presented to [facility] ED on 01/01/2022 for shortness of breath and nonproductive cough not improved with MDI. Per report of first responders, she was tripoding and in respiratory distress.Hs troponin 57 ? 115, BNP 1864, transaminases in 100s with normal bilirubin. EKG showed known LBBB with ST/T wave changes consistent with repolarization abnormality. Mild pulmonary edema on CXR. Initially treated as COPD exacerbation however, then cardiology was consulted for suspicion of heart failure. Echo revealed global hypokinesis with LVEF 20 to 25%, mild LVH, normal RV, grade 1 diastolic dysfunction, mild MR, AV sclerosis without AS. Right and left heart cath revealed nonobstructive CAD with 35% lesion mid RCA, ost LAD to prox LAD 30% stenosis significant myocardial bridging of the distal LAD, mild elevated LV filling pressures. PCWP mean 17 mmHg, LVEDP 20 mmHg, mild pulmonary hypertension.  PAP mean 30 mmHg, reduced cardiac output. She was initiated on GDMT including Jardiance 10 mg daily and Entresto 24/26 mg twice daily.  Holding on beta-blocker due to low cardiac output.  Avoiding aspirin in the setting of anaphylaxis to NSAIDs. Plan to repeat echo in 3 months and if no improvement in EF would need to consider BiV ICD due to chronic LBBB.  She was discharged on 01/04/2022.  Seen in follow-up by me on 02/04/22. Breathing improved drastically since prior to hospitalization. States for several months she did not understand why she was having such  difficulty breathing and felt like it was more than COPD. Home weight is stable. Admits poor po intake, per her friend "she eats like a bird."  Does not like the taste of water. Reduced smoking from 1 ppd to 4 cigarettes daily. Set a quit date of June 8. Denied chest pain, lower extremity edema, fatigue, palpitations, melena, hematuria, hemoptysis, diaphoresis, weakness, presyncope, syncope, orthopnea, and PND. Was advised to improve nutrition and monitor home BP.   EF remained < 20% on echo 05/2022 and she was referred to EP for consideration of defibrillator implant.  She underwent CRT-D implant on 06/10/2022 with Dr. Nelly Laurence.   Last cardiology clinic visit was 10/16/22 with Otilio Saber, PA.  She had normal ICD function with no history of shocks at that time.  Was over all doing well and advised to follow-up in 6 months with EP.  Today, she is here alone for follow-up. Overall feeling well, feels much better since having ICD implanted. Is able to move around without shortness of breath. Having some discomfort with swallowing. Has an appointment with GI, likely will need EGD. No regular exercise program, but active at home doing housework. Is frequently up and down stairs. Home SBP < 140 consistently. She denies chest pain, shortness of breath, lower extremity edema, fatigue, palpitations, melena, hematuria, hemoptysis, diaphoresis, weakness, presyncope, syncope, orthopnea, and PND. Has mild tenderness at the site of the ICD. We discussed diet. She is trying to eat more, drinking Ensure regularly.   Past Medical History:  Diagnosis Date   COPD (chronic obstructive pulmonary disease)    Degenerative  disc disease, lumbar    Hypertension    Neuropathy    Panic attack     Past Surgical History:  Procedure Laterality Date   BACK SURGERY     BIV ICD INSERTION CRT-D N/A 06/10/2022   Procedure: BIV ICD INSERTION CRT-D;  Surgeon: Mealor, Roberts GaudyAugustus E, MD;  [facility];  Service:  Cardiovascular;  Laterality: N/A;   CHOLECYSTECTOMY     RIGHT/LEFT HEART CATH AND CORONARY ANGIOGRAPHY N/A 01/02/2022   Procedure: RIGHT/LEFT HEART CATH AND CORONARY ANGIOGRAPHY;  Surgeon: SwazilandJordan, Peter M, MD;  [facility];  Service: Cardiovascular;  Laterality: N/A;    Current Medications: No outpatient medications have been marked as taking for the 12/14/22 encounter (Office Visit) with Lissa HoardSwinyer, Zachary GeorgeMichelle M, NP.     Allergies:   Ibuprofen and Nsaids   Social History   Socioeconomic History   Marital status: Legally Separated    Spouse name: Not on file   Number of children: Not on file   Years of education: Not on file   Highest education level: Not on file  Occupational History   Not on file  Tobacco Use   Smoking status: Every Day    Packs/day: .5    Types: Cigarettes   Smokeless tobacco: Never  Substance and Sexual Activity   Alcohol use: No   Drug use: Not on file   Sexual activity: Not on file  Other Topics Concern   Not on file  Social History Narrative   Not on file   Social Determinants of Health   Financial Resource Strain: Not on file  Food Insecurity: Not on file  Transportation Needs: Not on file  Physical Activity: Not on file  Stress: Not on file  Social Connections: Not on file     Family History: The patient's family history is not on file.  ROS:   Please see the history of present illness.   All other systems reviewed and are negative.  Labs/Other Studies Reviewed:    The following studies were reviewed today:  Echo 05/14/22  1. There is severe spherical remodeling. There is profound LBBB-related  systolic dyssynchrony. No left ventricular thrombus is seen (Definity  contrast used). Left ventricular ejection fraction, by estimation, is  <20%. The left ventricle has severely  decreased function. The left ventricle demonstrates global hypokinesis.  The left ventricular internal cavity size was moderately dilated. Left   ventricular diastolic parameters are consistent with Grade I diastolic  dysfunction (impaired relaxation).   2. Right ventricular systolic function is normal. The right ventricular  size is normal.   3. Left atrial size was mildly dilated.   4. The mitral valve is normal in structure. Mild mitral valve  regurgitation.   5. The aortic valve is tricuspid. Aortic valve regurgitation is not  visualized. No aortic stenosis is present.   6. The inferior vena cava is normal in size with greater than 50%  respiratory variability, suggesting right atrial pressure of 3 mmHg.   Comparison(s): No significant change from prior study. Prior images  reviewed side by side.   Conclusion(s)/Recommendation(s): Marked benefit would be expected from CRT  pacing.   R/LHC 01/02/22    Mid RCA lesion is 35% stenosed.   Ost LAD to Prox LAD lesion is 30% stenosed.   LV end diastolic pressure is mildly elevated.   Hemodynamic findings consistent with mild pulmonary hypertension.   Nonobstructive CAD. There is significant myocardial bridging of the distal LAD Mildly elevated LV  filling pressures. PCWP mean 17 mm Hg. LVEDP 20 mm Hg Mild pulmonary HTN. PAP mean 30 mm Hg Reduced cardiac output. Co-ox 60 with Cardiac output 3.25 L/min. Index 2.06.   Plan: medical therapy.  Echo 01/02/22  1. Left ventricular ejection fraction, by estimation, is 20 to 25%. The  left ventricle has severely decreased function. The left ventricle  demonstrates global hypokinesis. There is mild left ventricular  hypertrophy. Left ventricular diastolic parameters   are consistent with Grade I diastolic dysfunction (impaired relaxation).   2. Right ventricular systolic function is normal. The right ventricular  size is normal.   3. The mitral valve is normal in structure. Mild mitral valve  regurgitation. No evidence of mitral stenosis.   4. The aortic valve is tricuspid. There is mild calcification of the  aortic valve. There  is mild thickening of the aortic valve. Aortic valve  regurgitation is not visualized. Aortic valve sclerosis/calcification is  present, without any evidence of  aortic stenosis.   5. The inferior vena cava is normal in size with greater than 50%  respiratory variability, suggesting right atrial pressure of 3 mmHg.   Recent Labs: 01/01/2022: B Natriuretic Peptide 1,863.9; TSH 0.642 01/04/2022: Magnesium 1.9 02/04/2022: ALT 18 06/04/2022: BUN 8; Creatinine, Ser 0.73; Hemoglobin 14.6; Platelets 162; Potassium 3.5; Sodium 141   Recent Lipid Panel    Component Value Date/Time   CHOL 248 (H) 01/02/2022 0310   TRIG 38 01/02/2022 0310   HDL 73 01/02/2022 0310   CHOLHDL 3.4 01/02/2022 0310   VLDL 8 01/02/2022 0310   LDLCALC 167 (H) 01/02/2022 0310     Risk Assessment/Calculations:       Physical Exam:    VS:  BP 138/78   Pulse 85   Ht 5\' 4"  (1.626 m)   Wt 106 lb 3.2 oz (48.2 kg)   SpO2 96%   BMI 18.23 kg/m     Wt Readings from Last 3 Encounters:  12/14/22 106 lb 3.2 oz (48.2 kg)  10/16/22 108 lb (49 kg)  08/13/22 109 lb (49.4 kg)     GEN:  Thin female in no acute distress HEENT: Normal NECK: No JVD; No carotid bruits CARDIAC: RRR with gallop. No murmurs or rubs RESPIRATORY:  Clear to auscultation without rales, wheezing or rhonchi  ABDOMEN: Soft, non-tender, non-distended MUSCULOSKELETAL:  No edema; No deformity. 2+ pedal pulses, equal bilaterally SKIN: Warm and dry. ICD implant site is well healed NEUROLOGIC:  Alert and oriented x 3 PSYCHIATRIC:  Normal affect   EKG:  EKG is not ordered today.    Diagnoses:    1. Chronic combined systolic and diastolic CHF, NYHA class 2   2. Coronary artery disease involving native coronary artery of native heart without angina pectoris   3. Primary hypertension   4. NICM (nonischemic cardiomyopathy)   5. LBBB (left bundle branch block)   6. Tobacco abuse   7. Mild malnutrition   8. Hyperlipidemia LDL goal <70       Assessment and Plan:     NICM/Chronic combined CHF: NYHA Class II. Profound LBBB-related systolic dyssychrony with LVEF < 20% on TTE 05/14/22, no improvement from previous echo 4/28.  No s/p AICD implant. Feeling improvement in breathing, no longer having significant dyspnea on exertion. Appears euvolemic today on exam. No orthopnea, PND, edema, dyspnea. Ran out of Jardiance. We completed patient assistance paperwork for her today and samples provided. Continue GDMT including Entresto, Jardiance, metoprolol.   AICD implant: [employer] CRT-D implant 06/2022  secondary to chronic HFrEF. Feeling much better with less DOE since implant. Has chest tenderness at site of device, likely due to no subcutaneous fat in that area.  Management per EP.  CAD without angina: Cardiac cath 12/2021 revealed mild nonobstructive disease mid RCA, ost LAD to proximal LAD.  She denies chest pain, dyspnea, or other symptoms concerning for angina. No indication for further ischemic evaluation at this time. Continue rosuvastatin, metoprolol, empagliflozin.   Hyperlipidemia LDL goal < 70: LDL 167 on 05/24/59. Will recheck today. Continue rosuvastatin.   LBBB: LVEF severely reduced on TTE 05/2022 now s/p AICD implant.   Mild malnutrition: She continues to struggle with weight loss. Encouraged strength training and to record daily intake to ensure appropriate caloric intake.   Hypertension: BP is well controlled.  Tobacco abuse: Complete cessation advised.   Disposition: Keep August appointment with Dr. Mealor/December with Dr. Anne Fu  Medication Adjustments/Labs and Tests Ordered: Current medicines are reviewed at length with the patient today.  Concerns regarding medicines are outlined above.  Orders Placed This Encounter  Procedures   Comp Met (CMET)   CBC   Lipid Profile   No orders of the defined types were placed in this encounter.   Patient Instructions  Medication Instructions:   Your physician  recommends that you continue on your current medications as directed. Please refer to the Current Medication list given to you today.   *If you need a refill on your cardiac medications before your next appointment, please call your pharmacy*   Lab Work:  TODAY!!!! LIPID/CMET/CBC  If you have labs (blood work) drawn today and your tests are completely normal, you will receive your results only by: MyChart Message (if you have MyChart) OR A paper copy in the mail If you have any lab test that is abnormal or we need to change your treatment, we will call you to review the results.   Testing/Procedures:  None ordered.   Follow-Up: At [facility], you and your health needs are our priority.  As part of our continuing mission to provide you with exceptional heart care, we have created designated Provider Care Teams.  These Care Teams include your primary Cardiologist (physician) and Advanced Practice Providers (APPs -  Physician Assistants and Nurse Practitioners) who all work together to provide you with the care you need, when you need it.  We recommend signing up for the patient portal called "MyChart".  Sign up information is provided on this After Visit Summary.  MyChart is used to connect with patients for Virtual Visits (Telemedicine).  Patients are able to view lab/test results, encounter notes, upcoming appointments, etc.  Non-urgent messages can be sent to your provider as well.   To learn more about what you can do with MyChart, go to [email or web address].    Your next appointment:   8 month(s)  Provider:   Donato Schultz, MD     Other Instructions  Your physician wants you to follow-up in: 8 months with Dr. Anne Fu.  You will receive a reminder letter in the mail two months in advance. If you don't receive a letter, please call our office to schedule the follow-up appointment.     Signed, Levi Aland, NP  12/14/2022 11:34 AM    [facility]

## 2022-12-14 ENCOUNTER — Ambulatory Visit: Payer: Medicare Other | Attending: Nurse Practitioner | Admitting: Nurse Practitioner

## 2022-12-14 ENCOUNTER — Encounter: Payer: Self-pay | Admitting: Nurse Practitioner

## 2022-12-14 VITALS — BP 138/78 | HR 85 | Ht 64.0 in | Wt 106.2 lb

## 2022-12-14 DIAGNOSIS — I5042 Chronic combined systolic (congestive) and diastolic (congestive) heart failure: Secondary | ICD-10-CM

## 2022-12-14 DIAGNOSIS — I447 Left bundle-branch block, unspecified: Secondary | ICD-10-CM

## 2022-12-14 DIAGNOSIS — E785 Hyperlipidemia, unspecified: Secondary | ICD-10-CM

## 2022-12-14 DIAGNOSIS — I428 Other cardiomyopathies: Secondary | ICD-10-CM | POA: Diagnosis not present

## 2022-12-14 DIAGNOSIS — Z720 Tobacco use: Secondary | ICD-10-CM

## 2022-12-14 DIAGNOSIS — I2510 Atherosclerotic heart disease of native coronary artery without angina pectoris: Secondary | ICD-10-CM

## 2022-12-14 DIAGNOSIS — I10 Essential (primary) hypertension: Secondary | ICD-10-CM | POA: Diagnosis not present

## 2022-12-14 DIAGNOSIS — E441 Mild protein-calorie malnutrition: Secondary | ICD-10-CM

## 2022-12-14 NOTE — Patient Instructions (Signed)
Medication Instructions:   Your physician recommends that you continue on your current medications as directed. Please refer to the Current Medication list given to you today.   *If you need a refill on your cardiac medications before your next appointment, please call your pharmacy*   Lab Work:  TODAY!!!! LIPID/CMET/CBC  If you have labs (blood work) drawn today and your tests are completely normal, you will receive your results only by: MyChart Message (if you have MyChart) OR A paper copy in the mail If you have any lab test that is abnormal or we need to change your treatment, we will call you to review the results.   Testing/Procedures:  None ordered.   Follow-Up: At [facility], you and your health needs are our priority.  As part of our continuing mission to provide you with exceptional heart care, we have created designated Provider Care Teams.  These Care Teams include your primary Cardiologist (physician) and Advanced Practice Providers (APPs -  Physician Assistants and Nurse Practitioners) who all work together to provide you with the care you need, when you need it.  We recommend signing up for the patient portal called "MyChart".  Sign up information is provided on this After Visit Summary.  MyChart is used to connect with patients for Virtual Visits (Telemedicine).  Patients are able to view lab/test results, encounter notes, upcoming appointments, etc.  Non-urgent messages can be sent to your provider as well.   To learn more about what you can do with MyChart, go to [email or web address].    Your next appointment:   8 month(s)  Provider:   Donato Schultz, MD     Other Instructions  Your physician wants you to follow-up in: 8 months with Dr. Anne Fu.  You will receive a reminder letter in the mail two months in advance. If you don't receive a letter, please call our office to schedule the follow-up appointment.

## 2022-12-15 LAB — COMPREHENSIVE METABOLIC PANEL
ALT: 18 IU/L (ref 0–32)
AST: 23 IU/L (ref 0–40)
Albumin/Globulin Ratio: 1.7 (ref 1.2–2.2)
Albumin: 4.4 g/dL (ref 3.9–4.9)
Alkaline Phosphatase: 92 IU/L (ref 44–121)
BUN/Creatinine Ratio: 12 (ref 12–28)
BUN: 9 mg/dL (ref 8–27)
Bilirubin Total: 0.4 mg/dL (ref 0.0–1.2)
CO2: 28 mmol/L (ref 20–29)
Calcium: 9.8 mg/dL (ref 8.7–10.3)
Chloride: 101 mmol/L (ref 96–106)
Creatinine, Ser: 0.74 mg/dL (ref 0.57–1.00)
Globulin, Total: 2.6 g/dL (ref 1.5–4.5)
Glucose: 101 mg/dL — ABNORMAL HIGH (ref 70–99)
Potassium: 3.7 mmol/L (ref 3.5–5.2)
Sodium: 145 mmol/L — ABNORMAL HIGH (ref 134–144)
Total Protein: 7 g/dL (ref 6.0–8.5)
eGFR: 90 mL/min/{1.73_m2} (ref >59)

## 2022-12-15 LAB — CBC
Hematocrit: 44.2 % (ref 34.0–46.6)
Hemoglobin: 14.5 g/dL (ref 11.1–15.9)
MCH: 28.3 pg (ref 26.6–33.0)
MCHC: 32.8 g/dL (ref 31.5–35.7)
MCV: 86 fL (ref 79–97)
Platelets: 129 10*3/uL — ABNORMAL LOW (ref 150–450)
RBC: 5.13 x10E6/uL (ref 3.77–5.28)
RDW: 13.2 % (ref 11.7–15.4)
WBC: 5.7 10*3/uL (ref 3.4–10.8)

## 2022-12-15 LAB — LIPID PANEL
Chol/HDL Ratio: 2.8 ratio (ref 0.0–4.4)
Cholesterol, Total: 192 mg/dL (ref 100–199)
HDL: 68 mg/dL (ref >39)
LDL Chol Calc (NIH): 110 mg/dL — ABNORMAL HIGH (ref 0–99)
Triglycerides: 74 mg/dL (ref 0–149)
VLDL Cholesterol Cal: 14 mg/dL (ref 5–40)

## 2022-12-16 ENCOUNTER — Ambulatory Visit (INDEPENDENT_AMBULATORY_CARE_PROVIDER_SITE_OTHER): Payer: Medicare Other

## 2022-12-16 DIAGNOSIS — I428 Other cardiomyopathies: Secondary | ICD-10-CM

## 2022-12-16 LAB — CUP PACEART REMOTE DEVICE CHECK
Battery Remaining Longevity: 86 mo
Battery Remaining Percentage: 90 %
Battery Voltage: 3.01 V
Brady Statistic AP VP Percent: 1 %
Brady Statistic AP VS Percent: 1 %
Brady Statistic AS VP Percent: 97 %
Brady Statistic AS VS Percent: 2 %
Brady Statistic RA Percent Paced: 1 %
Date Time Interrogation Session: 20240410030057
HighPow Impedance: 69 Ohm
Implantable Lead Connection Status: 753985
Implantable Lead Connection Status: 753985
Implantable Lead Connection Status: 753985
Implantable Lead Implant Date: 20231004
Implantable Lead Implant Date: 20231004
Implantable Lead Implant Date: 20231004
Implantable Lead Location: 753858
Implantable Lead Location: 753859
Implantable Lead Location: 753860
Implantable Pulse Generator Implant Date: 20231004
Lead Channel Impedance Value: 440 Ohm
Lead Channel Impedance Value: 510 Ohm
Lead Channel Impedance Value: 530 Ohm
Lead Channel Pacing Threshold Amplitude: 0.5 V
Lead Channel Pacing Threshold Amplitude: 0.75 V
Lead Channel Pacing Threshold Amplitude: 1.75 V
Lead Channel Pacing Threshold Pulse Width: 0.5 ms
Lead Channel Pacing Threshold Pulse Width: 0.5 ms
Lead Channel Pacing Threshold Pulse Width: 0.5 ms
Lead Channel Sensing Intrinsic Amplitude: 0.5 mV
Lead Channel Sensing Intrinsic Amplitude: 4.7 mV
Lead Channel Setting Pacing Amplitude: 1.5 V
Lead Channel Setting Pacing Amplitude: 1.75 V
Lead Channel Setting Pacing Amplitude: 2.25 V
Lead Channel Setting Pacing Pulse Width: 0.5 ms
Lead Channel Setting Pacing Pulse Width: 0.5 ms
Lead Channel Setting Sensing Sensitivity: 0.5 mV
Pulse Gen Serial Number: 111063893
Zone Setting Status: 755011

## 2023-01-04 ENCOUNTER — Telehealth: Payer: Self-pay | Admitting: Nurse Practitioner

## 2023-01-04 ENCOUNTER — Telehealth: Payer: Self-pay | Admitting: Cardiology

## 2023-01-04 MED ORDER — EMPAGLIFLOZIN 10 MG PO TABS: 10.0000 mg | ORAL_TABLET | Freq: Every day | ORAL | 0 refills | Status: AC

## 2023-01-04 NOTE — Telephone Encounter (Signed)
Patient calling the office for samples of medication:   1.  What medication and dosage are you requesting samples for?   empagliflozin (JARDIANCE) 10 MG TABS tablet     2.  Are you currently out of this medication? Yes    

## 2023-01-04 NOTE — Telephone Encounter (Signed)
Pt has been given 14 days supply Jardiance 10 mg.  Will forward to Eligha Bridegroom, NP and Brien Mates, CMA, to see if they can update the application.

## 2023-01-04 NOTE — Telephone Encounter (Signed)
Pt c/o medication issue:  1. Name of Medication:   empagliflozin (JARDIANCE) 10 MG TABS tablet    2. How are you currently taking this medication (dosage and times per day)?   Take 1 tablet (10 mg total) by mouth daily.    3. Are you having a reaction (difficulty breathing--STAT)? No  4. What is your medication issue? Pt stated while she was in office at her last visit on 12/14/2022, she left the application for patient assistance with Provider to have office fill out their part and then fax over to [facility] Patient Assistance. Pt has contacted them and they stated nothing has been faxed over to office and pt is currently out of Jardiance now. She's very concerned as to why it hasn't been sent. Please advise.

## 2023-01-05 NOTE — Telephone Encounter (Signed)
**Note De-Identified Nancy Marsh Obfuscation** See Patient Message-Jardiance in her chart from yesterday for details.

## 2023-01-05 NOTE — Telephone Encounter (Signed)
Call placed to [employer], [place] for [place], to check upon patient's application.  They don't see anything from 12/14/22, when the original one was sent (confirmation received).  Faxed again this morning, (confirmation received).  Ask to verify if they have received it and was told it would take 24-48 hours to be processed and to call back in 2 days.

## 2023-01-06 NOTE — Telephone Encounter (Signed)
Spoke with Raven @ [employer], patient assistance for [employer]. Pt has been approved until 09/07/23 and will ship out her Jardiance tomorrow, 01/07/23.  Placed call to pt and she has been made aware.  Pt was very appreciative.

## 2023-01-22 NOTE — Progress Notes (Signed)
Remote ICD transmission.   

## 2023-03-17 ENCOUNTER — Ambulatory Visit (INDEPENDENT_AMBULATORY_CARE_PROVIDER_SITE_OTHER): Payer: Medicare Other

## 2023-03-17 DIAGNOSIS — I428 Other cardiomyopathies: Secondary | ICD-10-CM | POA: Diagnosis not present

## 2023-03-18 LAB — CUP PACEART REMOTE DEVICE CHECK
Battery Remaining Longevity: 84 mo
Battery Remaining Percentage: 87 %
Battery Voltage: 2.99 V
Brady Statistic AP VP Percent: 1 %
Brady Statistic AP VS Percent: 1 %
Brady Statistic AS VP Percent: 97 %
Brady Statistic AS VS Percent: 2.1 %
Brady Statistic RA Percent Paced: 1 %
Date Time Interrogation Session: 20240710214314
HighPow Impedance: 74 Ohm
Implantable Lead Connection Status: 753985
Implantable Lead Connection Status: 753985
Implantable Lead Connection Status: 753985
Implantable Lead Implant Date: 20231004
Implantable Lead Implant Date: 20231004
Implantable Lead Implant Date: 20231004
Implantable Lead Location: 753858
Implantable Lead Location: 753859
Implantable Lead Location: 753860
Implantable Pulse Generator Implant Date: 20231004
Lead Channel Impedance Value: 440 Ohm
Lead Channel Impedance Value: 550 Ohm
Lead Channel Impedance Value: 580 Ohm
Lead Channel Pacing Threshold Amplitude: 0.5 V
Lead Channel Pacing Threshold Amplitude: 0.75 V
Lead Channel Pacing Threshold Amplitude: 1.5 V
Lead Channel Pacing Threshold Pulse Width: 0.5 ms
Lead Channel Pacing Threshold Pulse Width: 0.5 ms
Lead Channel Pacing Threshold Pulse Width: 0.5 ms
Lead Channel Sensing Intrinsic Amplitude: 10.6 mV
Lead Channel Sensing Intrinsic Amplitude: 4.7 mV
Lead Channel Setting Pacing Amplitude: 1.5 V
Lead Channel Setting Pacing Amplitude: 1.75 V
Lead Channel Setting Pacing Amplitude: 2 V
Lead Channel Setting Pacing Pulse Width: 0.5 ms
Lead Channel Setting Pacing Pulse Width: 0.5 ms
Lead Channel Setting Sensing Sensitivity: 0.5 mV
Pulse Gen Serial Number: 111063893
Zone Setting Status: 755011

## 2023-04-08 NOTE — Progress Notes (Signed)
Remote ICD transmission.   

## 2023-04-18 NOTE — Progress Notes (Unsigned)
Cardiology Office Note:    Date:  04/19/2023   ID:  Nancy Marsh, DOB Sep 12, 1955, MRN [id number]  PCP:  Nancy Daniel, PA-C   [facility] Providers Cardiologist:  Donato Schultz, MD Electrophysiologist:  Maurice Small, MD     Referring MD: Nancy Daniel, PA-C    History of Present Illness:    Nancy Marsh is a 67 y.o. female with a hx of CHFrEF, nonobstructive CAD, LBBB, hypertension here for electrophysiology follow-up.     She was diagnosed with CHF at [facility] after presenting to [facility] with severe dyspnea. EF was 20-25%, without significant obstructive cardiomyopathy on left heart cath. She was noted to have LBBB. Despite continuing GDMT since discharge, EF in Sept, 2023 remains < 20%.  An Abbott BiV ICD was placed on June 10, 2022.  She reported subjective improvement with CRT.   Arrhythmia History: LBBB diagnosed 12/2021 at the time of diagnosis of CHF Abbott BiV ICD placed June 10, 2022     Today, she reports that she is doing very well.  She feels much better since the device was placed.  she has no device related complaints -- no new tenderness, drainage, redness.  EKGs/Labs/Other Studies Reviewed:    The following studies were reviewed today: TTE  01/02/2022: EF 20-25%, mild LVH Coronary angiogram 01/02/2022  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 PCWP mean 17 mm Hg. LVEDP 20 mm Hg Mild pulmonary HTN. PAP mean 30 mm Hg Reduced cardiac output. Co-ox 60 with Cardiac output 3.25 L/min. Index 2.06.   TTE: 05/14/2022 EF <20%, LV moderately dilated  EKG:  EKG is ordered today. It showed sinus rhythm with LBBB    Orders placed or performed in visit on 04/19/23   EKG 12-Lead     Recent Labs: 12/14/2022: ALT 18; BUN 9; Creatinine, Ser 0.74; Hemoglobin 14.5; Platelets 129; Potassium 3.7; Sodium 145        Physical Exam:    VS:  BP 120/70 (BP Location: Left Arm, Patient Position:  Sitting, Cuff Size: Normal)   Pulse 71   Ht 5\' 4"  (1.626 m)   Wt 103 lb (46.7 kg)   SpO2 98%   BMI 17.68 kg/m     Wt Readings from Last 3 Encounters:  04/19/23 103 lb (46.7 kg)  12/14/22 106 lb 3.2 oz (48.2 kg)  10/16/22 108 lb (49 kg)     GEN:  Well nourished, well developed in no acute distress CARDIAC: RRR, no murmurs, rubs, gallops RESPIRATORY:  Normal work of breathing MUSCULOSKELETAL: no edema  Device was interrogated today.  I reviewed the interrogation in detail.  See Paceart for report  ASSESSMENT and plan   Nonischemic cardiomyopathy: EF is persistently < 20% despite 90 days of GDMT Abbott CRT-D in place Repeat limited echo for EF  Chronic CHFrEF:  Appears compensated and euvolemic today Continue empagliflozin 10, Entresto 24-26 Increase metoprolol XL to 25 mg daily  Atrial high rate episodes Consistent with atrial tachycardia, and occasionally atrial flutter Longest episode this interrogation lasted 10 seconds Continue to monitor  LBBB:  qrs has typical morphology, significant V-V dyssynchrony on TTE, qrs > 150.         Medication Adjustments/Labs and Tests Ordered: Current medicines are reviewed at length with the patient today.  Concerns regarding medicines are outlined above.  Orders Placed This Encounter  Procedures   EKG 12-Lead   No orders of the defined types were placed in this  encounter.   There are no Patient Instructions on file for this visit.   Signed, Maurice Small, MD  04/19/2023 9:39 AM    [facility]

## 2023-04-19 ENCOUNTER — Ambulatory Visit: Payer: Medicare Other | Attending: Cardiovascular Disease | Admitting: Cardiovascular Disease

## 2023-04-19 ENCOUNTER — Encounter: Payer: Self-pay | Admitting: Cardiovascular Disease

## 2023-04-19 VITALS — BP 120/70 | HR 71 | Ht 64.0 in | Wt 103.0 lb

## 2023-04-19 DIAGNOSIS — I447 Left bundle-branch block, unspecified: Secondary | ICD-10-CM | POA: Diagnosis not present

## 2023-04-19 DIAGNOSIS — I428 Other cardiomyopathies: Secondary | ICD-10-CM | POA: Diagnosis not present

## 2023-04-19 DIAGNOSIS — Z95810 Presence of automatic (implantable) cardiac defibrillator: Secondary | ICD-10-CM

## 2023-04-19 MED ORDER — METOPROLOL SUCCINATE ER 25 MG PO TB24: 25.0000 mg | ORAL_TABLET | Freq: Every day | ORAL | 3 refills | Status: DC

## 2023-04-19 NOTE — Patient Instructions (Signed)
Medication Instructions:  INCREASE Metoprolol Succinate to 25 mg once daily  *If you need a refill on your cardiac medications before your next appointment, please call your pharmacy*   Testing/Procedures: Echocardiogram Your physician has requested that you have an echocardiogram. Echocardiography is a painless test that uses sound waves to create images of your heart. It provides your doctor with information about the size and shape of your heart and how well your heart's chambers and valves are working. This procedure takes approximately one hour. There are no restrictions for this procedure. Please do NOT wear cologne, perfume, aftershave, or lotions (deodorant is allowed). Please arrive 15 minutes prior to your appointment time.   Follow-Up: At [facility], you and your health needs are our priority.  As part of our continuing mission to provide you with exceptional heart care, we have created designated Provider Care Teams.  These Care Teams include your primary Cardiologist (physician) and Advanced Practice Providers (APPs -  Physician Assistants and Nurse Practitioners) who all work together to provide you with the care you need, when you need it.  We recommend signing up for the patient portal called "MyChart".  Sign up information is provided on this After Visit Summary.  MyChart is used to connect with patients for Virtual Visits (Telemedicine).  Patients are able to view lab/test results, encounter notes, upcoming appointments, etc.  Non-urgent messages can be sent to your provider as well.   To learn more about what you can do with MyChart, go to [email or web address].    Your next appointment:   1 year(s)  Provider:   York Pellant, MD

## 2023-05-17 ENCOUNTER — Ambulatory Visit (HOSPITAL_COMMUNITY): Payer: Medicare Other | Attending: Internal Medicine

## 2023-05-17 DIAGNOSIS — I447 Left bundle-branch block, unspecified: Secondary | ICD-10-CM

## 2023-05-17 DIAGNOSIS — I428 Other cardiomyopathies: Secondary | ICD-10-CM | POA: Diagnosis present

## 2023-05-17 DIAGNOSIS — Z95810 Presence of automatic (implantable) cardiac defibrillator: Secondary | ICD-10-CM

## 2023-05-17 LAB — ECHOCARDIOGRAM LIMITED
Area-P 1/2: 3.02 cm2
S' Lateral: 2.73 cm

## 2023-06-16 ENCOUNTER — Ambulatory Visit (INDEPENDENT_AMBULATORY_CARE_PROVIDER_SITE_OTHER): Payer: Medicare Other

## 2023-06-16 DIAGNOSIS — I428 Other cardiomyopathies: Secondary | ICD-10-CM | POA: Diagnosis not present

## 2023-06-16 LAB — CUP PACEART REMOTE DEVICE CHECK
Battery Remaining Longevity: 79 mo
Battery Remaining Percentage: 84 %
Battery Voltage: 2.99 V
Brady Statistic AP VP Percent: 1 %
Brady Statistic AP VS Percent: 1 %
Brady Statistic AS VP Percent: 97 %
Brady Statistic AS VS Percent: 2.1 %
Brady Statistic RA Percent Paced: 1 %
Date Time Interrogation Session: 20241008213641
HighPow Impedance: 71 Ohm
Implantable Lead Connection Status: 753985
Implantable Lead Connection Status: 753985
Implantable Lead Connection Status: 753985
Implantable Lead Implant Date: 20231004
Implantable Lead Implant Date: 20231004
Implantable Lead Implant Date: 20231004
Implantable Lead Location: 753858
Implantable Lead Location: 753859
Implantable Lead Location: 753860
Implantable Pulse Generator Implant Date: 20231004
Lead Channel Impedance Value: 410 Ohm
Lead Channel Impedance Value: 490 Ohm
Lead Channel Impedance Value: 530 Ohm
Lead Channel Pacing Threshold Amplitude: 0.5 V
Lead Channel Pacing Threshold Amplitude: 0.625 V
Lead Channel Pacing Threshold Amplitude: 1.125 V
Lead Channel Pacing Threshold Pulse Width: 0.5 ms
Lead Channel Pacing Threshold Pulse Width: 0.5 ms
Lead Channel Pacing Threshold Pulse Width: 0.5 ms
Lead Channel Sensing Intrinsic Amplitude: 11.5 mV
Lead Channel Sensing Intrinsic Amplitude: 4.4 mV
Lead Channel Setting Pacing Amplitude: 1.5 V
Lead Channel Setting Pacing Amplitude: 1.625
Lead Channel Setting Pacing Amplitude: 1.625
Lead Channel Setting Pacing Pulse Width: 0.5 ms
Lead Channel Setting Pacing Pulse Width: 0.5 ms
Lead Channel Setting Sensing Sensitivity: 0.5 mV
Pulse Gen Serial Number: 111063893
Zone Setting Status: 755011

## 2023-07-07 NOTE — Progress Notes (Signed)
Remote ICD transmission.   

## 2023-09-15 ENCOUNTER — Ambulatory Visit: Payer: Medicare Other

## 2023-09-15 DIAGNOSIS — I447 Left bundle-branch block, unspecified: Secondary | ICD-10-CM | POA: Diagnosis not present

## 2023-09-20 LAB — CUP PACEART REMOTE DEVICE CHECK
Battery Remaining Longevity: 77 mo
Battery Remaining Percentage: 81 %
Battery Voltage: 2.98 V
Brady Statistic AP VP Percent: 1 %
Brady Statistic AP VS Percent: 1 %
Brady Statistic AS VP Percent: 95 %
Brady Statistic AS VS Percent: 3 %
Brady Statistic RA Percent Paced: 1 %
Date Time Interrogation Session: 20250113104407
HighPow Impedance: 64 Ohm
Implantable Lead Connection Status: 753985
Implantable Lead Connection Status: 753985
Implantable Lead Connection Status: 753985
Implantable Lead Implant Date: 20231004
Implantable Lead Implant Date: 20231004
Implantable Lead Implant Date: 20231004
Implantable Lead Location: 753858
Implantable Lead Location: 753859
Implantable Lead Location: 753860
Implantable Pulse Generator Implant Date: 20231004
Lead Channel Impedance Value: 410 Ohm
Lead Channel Impedance Value: 450 Ohm
Lead Channel Impedance Value: 540 Ohm
Lead Channel Pacing Threshold Amplitude: 0.5 V
Lead Channel Pacing Threshold Amplitude: 0.75 V
Lead Channel Pacing Threshold Amplitude: 1.5 V
Lead Channel Pacing Threshold Pulse Width: 0.5 ms
Lead Channel Pacing Threshold Pulse Width: 0.5 ms
Lead Channel Pacing Threshold Pulse Width: 0.5 ms
Lead Channel Sensing Intrinsic Amplitude: 10.6 mV
Lead Channel Sensing Intrinsic Amplitude: 5 mV
Lead Channel Setting Pacing Amplitude: 1.5 V
Lead Channel Setting Pacing Amplitude: 1.75 V
Lead Channel Setting Pacing Amplitude: 2 V
Lead Channel Setting Pacing Pulse Width: 0.5 ms
Lead Channel Setting Pacing Pulse Width: 0.5 ms
Lead Channel Setting Sensing Sensitivity: 0.5 mV
Pulse Gen Serial Number: 111063893
Zone Setting Status: 755011

## 2023-10-29 ENCOUNTER — Telehealth: Payer: Self-pay

## 2023-10-29 NOTE — Addendum Note (Signed)
Addended by: Elease Etienne A on: 10/29/2023 08:35 AM   Modules accepted: Orders

## 2023-10-29 NOTE — Progress Notes (Signed)
 Remote ICD transmission.

## 2023-10-29 NOTE — Telephone Encounter (Signed)
Alert received from [employer] for intermittent ventricular oversensing noted on presenting EGM.   Spoke with Ignacio from [place]. Jude who recommended in-clinic eval. With Dr. Nelly Laurence present to evaluate further.

## 2023-11-05 ENCOUNTER — Ambulatory Visit: Payer: Medicare Other | Attending: Cardiovascular Disease | Admitting: Cardiovascular Disease

## 2023-11-05 ENCOUNTER — Encounter: Payer: Self-pay | Admitting: Cardiovascular Disease

## 2023-11-05 VITALS — BP 112/82 | HR 87 | Ht 64.0 in | Wt 97.8 lb

## 2023-11-05 DIAGNOSIS — I447 Left bundle-branch block, unspecified: Secondary | ICD-10-CM | POA: Diagnosis not present

## 2023-11-05 DIAGNOSIS — I5042 Chronic combined systolic (congestive) and diastolic (congestive) heart failure: Secondary | ICD-10-CM

## 2023-11-05 DIAGNOSIS — I428 Other cardiomyopathies: Secondary | ICD-10-CM

## 2023-11-05 DIAGNOSIS — Z95810 Presence of automatic (implantable) cardiac defibrillator: Secondary | ICD-10-CM | POA: Diagnosis not present

## 2023-11-05 NOTE — Patient Instructions (Signed)
Medication Instructions:  Your physician recommends that you continue on your current medications as directed. Please refer to the Current Medication list given to you today. *If you need a refill on your cardiac medications before your next appointment, please call your pharmacy*   Follow-Up: At [facility], you and your health needs are our priority.  As part of our continuing mission to provide you with exceptional heart care, we have created designated Provider Care Teams.  These Care Teams include your primary Cardiologist (physician) and Advanced Practice Providers (APPs -  Physician Assistants and Nurse Practitioners) who all work together to provide you with the care you need, when you need it.  We recommend signing up for the patient portal called "MyChart".  Sign up information is provided on this After Visit Summary.  MyChart is used to connect with patients for Virtual Visits (Telemedicine).  Patients are able to view lab/test results, encounter notes, upcoming appointments, etc.  Non-urgent messages can be sent to your provider as well.   To learn more about what you can do with MyChart, go to [email or web address].    Your next appointment:   6 month(s)  Provider:   You will see one of the following Advanced Practice Providers on your designated Care Team:   Francis Dowse, Charlott Holler [address] [place], [place] Sherie Don, NP Canary Brim, NP

## 2023-11-05 NOTE — Progress Notes (Signed)
 Cardiology Office Note:    Date:  11/05/2023   ID:  Nancy Marsh, DOB 11-Mar-1956, MRN [id number]  PCP:  Ladora Daniel, PA-C   [facility] Providers Cardiologist:  Donato Schultz, MD Electrophysiologist:  Maurice Small, MD     Referring MD: Ladora Daniel, PA-C    History of Present Illness:    Nancy Marsh is a 68 y.o. female with a hx of CHFrEF, nonobstructive CAD, LBBB, hypertension here for electrophysiology follow-up.     She was diagnosed with CHF at [facility] after presenting to [facility] with severe dyspnea. EF was 20-25%, without significant obstructive cardiomyopathy on left heart cath. She was noted to have LBBB. Despite continuing GDMT since discharge, EF in Sept, 2023 remains < 20%.  An Abbott BiV ICD was placed on June 10, 2022.  She reported subjective improvement with CRT.   Arrhythmia History: LBBB diagnosed 12/2021 at the time of diagnosis of CHF Abbott BiV ICD placed June 10, 2022     Today, she reports that she is doing very well.  She feels much better since the device was placed.  she has no device related complaints -- no new tenderness, drainage, redness.  EKGs/Labs/Other Studies Reviewed:    The following studies were reviewed today: TTE  01/02/2022: EF 20-25%, mild LVH Coronary angiogram 01/02/2022  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 PCWP mean 17 mm Hg. LVEDP 20 mm Hg Mild pulmonary HTN. PAP mean 30 mm Hg Reduced cardiac output. Co-ox 60 with Cardiac output 3.25 L/min. Index 2.06.   TTE: 05/14/2022 EF <20%, LV moderately dilated  TTE: 05/17/2023 EF 55-60%  EKG:  EKG is ordered today. It showed sinus rhythm with LBBB    Orders placed or performed in visit on 11/05/23   EKG 12-Lead     Recent Labs: 12/14/2022: ALT 18; BUN 9; Creatinine, Ser 0.74; Hemoglobin 14.5; Platelets 129; Potassium 3.7; Sodium 145        Physical Exam:    VS:  BP 112/82 (BP Location:  Left Arm, Patient Position: Sitting, Cuff Size: Normal)   Pulse 87   Ht 5\' 4"  (1.626 m)   Wt 97 lb 12.8 oz (44.4 kg)   SpO2 96%   BMI 16.79 kg/m     Wt Readings from Last 3 Encounters:  11/05/23 97 lb 12.8 oz (44.4 kg)  04/19/23 103 lb (46.7 kg)  12/14/22 106 lb 3.2 oz (48.2 kg)     GEN:  Well nourished, well developed in no acute distress CARDIAC: RRR, no murmurs, rubs, gallops RESPIRATORY:  Normal work of breathing MUSCULOSKELETAL: no edema  Device was interrogated today.  I reviewed the interrogation in detail.  See Paceart for report  ASSESSMENT and plan   Nonischemic cardiomyopathy: EF is persistently < 20% despite 90 days of GDMT Abbott CRT-D in place - normal function I reviewed today's device interrogation in detail She is not pacemaker dependent EF has returned to normal with CRT  Chronic CHFrEF:  Appears compensated and euvolemic today Continue empagliflozin 10, Entresto 24-26, metoprolol XL 25 mg daily  VT:  ATP-treated episode in September 2024  EF has since recovered. Continue to monitor  Atrial high rate episodes Consistent with atrial tachycardia, and occasionally atrial flutter Continue to monitor  LBBB:  qrs has typical morphology, significant V-V dyssynchrony on TTE, qrs > 150.         Medication Adjustments/Labs and Tests Ordered: Current medicines are reviewed at length with  the patient today.  Concerns regarding medicines are outlined above.  Orders Placed This Encounter  Procedures   EKG 12-Lead   No orders of the defined types were placed in this encounter.   There are no Patient Instructions on file for this visit.   Signed, Maurice Small, MD  11/05/2023 9:07 AM    [facility]

## 2023-11-08 ENCOUNTER — Telehealth: Payer: Self-pay | Admitting: Cardiovascular Disease

## 2023-11-08 NOTE — Telephone Encounter (Signed)
 Device clearance for MRI received.  Completed and faxed back as requested.

## 2023-11-08 NOTE — Telephone Encounter (Signed)
 Follow Up:     Nancy Marsh said that she need the form that she sent over to be completed for clearance. She said they received  progress notes, they need the original form completed.

## 2023-12-15 ENCOUNTER — Ambulatory Visit (INDEPENDENT_AMBULATORY_CARE_PROVIDER_SITE_OTHER): Payer: Medicare Other

## 2023-12-15 DIAGNOSIS — I428 Other cardiomyopathies: Secondary | ICD-10-CM

## 2023-12-16 LAB — CUP PACEART REMOTE DEVICE CHECK
Battery Remaining Longevity: 74 mo
Battery Remaining Percentage: 77 %
Battery Voltage: 2.98 V
Brady Statistic AP VP Percent: 1 %
Brady Statistic AP VS Percent: 1 %
Brady Statistic AS VP Percent: 95 %
Brady Statistic AS VS Percent: 3.1 %
Brady Statistic RA Percent Paced: 1 %
Date Time Interrogation Session: 20250409030049
HighPow Impedance: 61 Ohm
Implantable Lead Connection Status: 753985
Implantable Lead Connection Status: 753985
Implantable Lead Connection Status: 753985
Implantable Lead Implant Date: 20231004
Implantable Lead Implant Date: 20231004
Implantable Lead Implant Date: 20231004
Implantable Lead Location: 753858
Implantable Lead Location: 753859
Implantable Lead Location: 753860
Implantable Pulse Generator Implant Date: 20231004
Lead Channel Impedance Value: 390 Ohm
Lead Channel Impedance Value: 410 Ohm
Lead Channel Impedance Value: 490 Ohm
Lead Channel Pacing Threshold Amplitude: 0.5 V
Lead Channel Pacing Threshold Amplitude: 0.75 V
Lead Channel Pacing Threshold Amplitude: 0.875 V
Lead Channel Pacing Threshold Pulse Width: 0.5 ms
Lead Channel Pacing Threshold Pulse Width: 0.5 ms
Lead Channel Pacing Threshold Pulse Width: 0.5 ms
Lead Channel Sensing Intrinsic Amplitude: 12 mV
Lead Channel Sensing Intrinsic Amplitude: 4.2 mV
Lead Channel Setting Pacing Amplitude: 1.375
Lead Channel Setting Pacing Amplitude: 1.5 V
Lead Channel Setting Pacing Amplitude: 1.75 V
Lead Channel Setting Pacing Pulse Width: 0.5 ms
Lead Channel Setting Pacing Pulse Width: 0.5 ms
Lead Channel Setting Sensing Sensitivity: 1 mV
Pulse Gen Serial Number: 111063893
Zone Setting Status: 755011

## 2024-02-01 NOTE — Addendum Note (Signed)
 Addended by: Lott Rouleau A on: 02/01/2024 08:57 AM   Modules accepted: Orders

## 2024-02-01 NOTE — Progress Notes (Signed)
 Remote ICD transmission.

## 2024-02-22 ENCOUNTER — Other Ambulatory Visit: Payer: Self-pay | Admitting: Cardiovascular Disease

## 2024-03-15 ENCOUNTER — Ambulatory Visit: Payer: Medicare Other

## 2024-03-15 DIAGNOSIS — I428 Other cardiomyopathies: Secondary | ICD-10-CM

## 2024-03-16 LAB — CUP PACEART REMOTE DEVICE CHECK
Battery Remaining Longevity: 71 mo
Battery Remaining Percentage: 75 %
Battery Voltage: 2.98 V
Brady Statistic AP VP Percent: 1 %
Brady Statistic AP VS Percent: 1 %
Brady Statistic AS VP Percent: 95 %
Brady Statistic AS VS Percent: 2.9 %
Brady Statistic RA Percent Paced: 1 %
Date Time Interrogation Session: 20250709214911
HighPow Impedance: 69 Ohm
Implantable Lead Connection Status: 753985
Implantable Lead Connection Status: 753985
Implantable Lead Connection Status: 753985
Implantable Lead Implant Date: 20231004
Implantable Lead Implant Date: 20231004
Implantable Lead Implant Date: 20231004
Implantable Lead Location: 753858
Implantable Lead Location: 753859
Implantable Lead Location: 753860
Implantable Pulse Generator Implant Date: 20231004
Lead Channel Impedance Value: 400 Ohm
Lead Channel Impedance Value: 440 Ohm
Lead Channel Impedance Value: 480 Ohm
Lead Channel Pacing Threshold Amplitude: 0.5 V
Lead Channel Pacing Threshold Amplitude: 0.75 V
Lead Channel Pacing Threshold Amplitude: 0.875 V
Lead Channel Pacing Threshold Pulse Width: 0.5 ms
Lead Channel Pacing Threshold Pulse Width: 0.5 ms
Lead Channel Pacing Threshold Pulse Width: 0.5 ms
Lead Channel Sensing Intrinsic Amplitude: 0.9 mV
Lead Channel Sensing Intrinsic Amplitude: 5 mV
Lead Channel Setting Pacing Amplitude: 1.375
Lead Channel Setting Pacing Amplitude: 1.5 V
Lead Channel Setting Pacing Amplitude: 1.75 V
Lead Channel Setting Pacing Pulse Width: 0.5 ms
Lead Channel Setting Pacing Pulse Width: 0.5 ms
Lead Channel Setting Sensing Sensitivity: 1 mV
Pulse Gen Serial Number: 111063893
Zone Setting Status: 755011

## 2024-03-20 ENCOUNTER — Ambulatory Visit: Payer: Self-pay | Admitting: Cardiovascular Disease

## 2024-04-26 NOTE — Progress Notes (Unsigned)
  Electrophysiology Office Note:   Date:  04/28/2024  ID:  Debara Connelley , DOB 06-27-1956, MRN [id number]  Primary Cardiologist: Oneil Parchment, MD Primary Heart Failure: None Electrophysiologist: Eulas FORBES Furbish, MD       History of Present Illness:   Nancy Marsh  is a 68 y.o. female with h/o LBBB, HFrEF s/p ICD, non-obs CAD, HTN seen today for routine electrophysiology followup.   Since last being seen in our clinic the patient reports doing well overall. No concerns with device. She is compliant with her medications.     She denies chest pain, palpitations, dyspnea, PND, orthopnea, nausea, vomiting, dizziness, syncope, edema, weight gain, or early satiety.   Review of systems complete and found to be negative unless listed in HPI.   EP Information / Studies Reviewed:    EKG is ordered today. Personal review as below.  EKG Interpretation Date/Time:  Friday April 28 2024 11:02:40 EDT Ventricular Rate:  83 PR Interval:  114 QRS Duration:  130 QT Interval:  422 QTC Calculation: 495 R Axis:   81  Text Interpretation: Atrial-sensed ventricular-paced rhythm Biventricular pacemaker detected Confirmed by Aniceto Jarvis ([id number]) on 04/28/2024 11:55:54 AM   ICD Interrogation-  reviewed in detail today,  See PACEART report.  Device History: Abbott BiV ICD implanted 06/10/22 for HFrEF, LBBB History of appropriate therapy: Yes, ATP for VT in 05/2023 History of AAD therapy: No       Physical Exam:   VS:  BP (!) 142/78 (BP Location: Right Arm, Patient Position: Sitting, Cuff Size: Normal)   Pulse 82   Ht 5' 4 (1.626 m)   Wt 98 lb (44.5 kg)   SpO2 96%   BMI 16.82 kg/m    Wt Readings from Last 3 Encounters:  04/28/24 98 lb (44.5 kg)  11/05/23 97 lb 12.8 oz (44.4 kg)  04/19/23 103 lb (46.7 kg)     GEN: Well nourished, well developed in no acute distress NECK: No JVD; No carotid bruits CARDIAC: Regular rate and rhythm, no murmurs, rubs, gallops, device site wnl / no tethering   RESPIRATORY:  Clear to auscultation without rales, wheezing or rhonchi  ABDOMEN: Soft, non-tender, non-distended EXTREMITIES:  No edema; No deformity   ASSESSMENT AND PLAN:    Chronic Systolic Dysfunction due to NICM s/p Abbott CRT-D  LBBB LVEF recovered to 55-60% with device as of 05/2023  -euvolemic today -EKG as above  -96% BiV pacing  -Stable on an appropriate medical regimen -Normal ICD function -See Pace Art report -No changes today -Pt noted to have ventricular lead noise > unable to reproduce with isometric exercises.  Very subtle on EGM and brief.  Reviewed with [employer], will have her com back to turn on Secure Sense.   VT  ATP in 05/2023  -none further on device     Atrial High Rate Episodes  -previously consistent with AT / occ AFL  -monitor burden on device    Disposition:   Follow up with Dr. Furbish in 12 months   Signed, Jarvis Aniceto, NP-C, AGACNP-BC [facility] - Electrophysiology  04/28/2024, 5:48 PM

## 2024-04-28 ENCOUNTER — Telehealth: Payer: Self-pay | Admitting: Pulmonary Disease

## 2024-04-28 ENCOUNTER — Ambulatory Visit: Attending: Pulmonary Disease | Admitting: Pulmonary Disease

## 2024-04-28 ENCOUNTER — Encounter: Payer: Self-pay | Admitting: Pulmonary Disease

## 2024-04-28 VITALS — BP 142/78 | HR 82 | Ht 64.0 in | Wt 98.0 lb

## 2024-04-28 DIAGNOSIS — I447 Left bundle-branch block, unspecified: Secondary | ICD-10-CM

## 2024-04-28 DIAGNOSIS — Z95810 Presence of automatic (implantable) cardiac defibrillator: Secondary | ICD-10-CM | POA: Diagnosis not present

## 2024-04-28 DIAGNOSIS — I5042 Chronic combined systolic (congestive) and diastolic (congestive) heart failure: Secondary | ICD-10-CM | POA: Diagnosis not present

## 2024-04-28 DIAGNOSIS — I428 Other cardiomyopathies: Secondary | ICD-10-CM | POA: Diagnosis not present

## 2024-04-28 DIAGNOSIS — I472 Ventricular tachycardia, unspecified: Secondary | ICD-10-CM

## 2024-04-28 LAB — CUP PACEART INCLINIC DEVICE CHECK
Date Time Interrogation Session: 20250822174345
Implantable Lead Connection Status: 753985
Implantable Lead Connection Status: 753985
Implantable Lead Connection Status: 753985
Implantable Lead Implant Date: 20231004
Implantable Lead Implant Date: 20231004
Implantable Lead Implant Date: 20231004
Implantable Lead Location: 753858
Implantable Lead Location: 753859
Implantable Lead Location: 753860
Implantable Pulse Generator Implant Date: 20231004
Pulse Gen Serial Number: 111063893

## 2024-04-28 NOTE — Telephone Encounter (Signed)
 Called patient back after clinic visit > reviewed ventricular lead noise with [employer] representatives.  Patient does not have Secure Sense turned on at this time.  Discussed reasons for turning on device with patient.  Will have her come back to device clinic to have Secure Sense turned on.      Cassie,   Can we please make her an appt for next week? Should be a very quick visit.    Daphne Barrack, NP-C, AGACNP-BC [facility] - Electrophysiology  04/28/2024, 5:43 PM

## 2024-04-28 NOTE — Patient Instructions (Signed)
 Medication Instructions:  Your physician recommends that you continue on your current medications as directed. Please refer to the Current Medication list given to you today.  *If you need a refill on your cardiac medications before your next appointment, please call your pharmacy*  Lab Work: None ordered If you have labs (blood work) drawn today and your tests are completely normal, you will receive your results only by: MyChart Message (if you have MyChart) OR A paper copy in the mail If you have any lab test that is abnormal or we need to change your treatment, we will call you to review the results.  Follow-Up: At [facility], you and your health needs are our priority.  As part of our continuing mission to provide you with exceptional heart care, our providers are all part of one team.  This team includes your primary Cardiologist (physician) and Advanced Practice Providers or APPs (Physician Assistants and Nurse Practitioners) who all work together to provide you with the care you need, when you need it.  Your next appointment:   1 year(s)  Provider:   Eulas Furbish, MD or Daphne Barrack, NP

## 2024-05-03 ENCOUNTER — Ambulatory Visit: Payer: Self-pay | Admitting: Cardiovascular Disease

## 2024-05-04 NOTE — Telephone Encounter (Signed)
 Attempted to call patient again after not being able to reach her. Patient has been scheduled for 9/11 w/ Device Clinic.

## 2024-05-18 ENCOUNTER — Ambulatory Visit: Attending: Cardiology

## 2024-05-18 DIAGNOSIS — I447 Left bundle-branch block, unspecified: Secondary | ICD-10-CM

## 2024-05-18 LAB — CUP PACEART INCLINIC DEVICE CHECK
Battery Remaining Longevity: 64 mo
Brady Statistic RA Percent Paced: 0.04 %
Brady Statistic RV Percent Paced: 95 %
Date Time Interrogation Session: 20250911115329
HighPow Impedance: 66.375
Implantable Lead Connection Status: 753985
Implantable Lead Connection Status: 753985
Implantable Lead Connection Status: 753985
Implantable Lead Implant Date: 20231004
Implantable Lead Implant Date: 20231004
Implantable Lead Implant Date: 20231004
Implantable Lead Location: 753858
Implantable Lead Location: 753859
Implantable Lead Location: 753860
Implantable Pulse Generator Implant Date: 20231004
Lead Channel Impedance Value: 412.5 Ohm
Lead Channel Impedance Value: 412.5 Ohm
Lead Channel Impedance Value: 500 Ohm
Lead Channel Pacing Threshold Amplitude: 0.5 V
Lead Channel Pacing Threshold Amplitude: 0.75 V
Lead Channel Pacing Threshold Amplitude: 1 V
Lead Channel Pacing Threshold Pulse Width: 0.5 ms
Lead Channel Pacing Threshold Pulse Width: 0.5 ms
Lead Channel Pacing Threshold Pulse Width: 0.5 ms
Lead Channel Sensing Intrinsic Amplitude: 0.5 mV
Lead Channel Sensing Intrinsic Amplitude: 4.2 mV
Lead Channel Setting Pacing Amplitude: 1.5 V
Lead Channel Setting Pacing Amplitude: 1.5 V
Lead Channel Setting Pacing Amplitude: 1.75 V
Lead Channel Setting Pacing Pulse Width: 0.5 ms
Lead Channel Setting Pacing Pulse Width: 0.5 ms
Lead Channel Setting Sensing Sensitivity: 1 mV
Pulse Gen Serial Number: 111063893
Zone Setting Status: 755011

## 2024-05-18 NOTE — Patient Instructions (Signed)
 Follow up as scheduled.

## 2024-05-18 NOTE — Progress Notes (Signed)
 Patient returned to clinic for device reprogramming per request by provider. Previously seen by Daphne Barrack, NP on 04/28/2024. Noted to have ventricular lead noise & was unable to reproduce w/ isometric exercises during OV.   Secure sense programmed on today after previously reviewing w/ [employer] Representative. Will continue to monitor and update accordingly.

## 2024-06-07 ENCOUNTER — Telehealth: Payer: Self-pay

## 2024-06-07 NOTE — Telephone Encounter (Signed)
 Late Entry: 06/07/24 @ 17:20  Alert received from [employer] for Non-sustained RV oversensing. Multiple non-sustained RV oversensing events 9/30 and 10/1.  Consulted with Dorise Cassis. [employer] Rep who advised he will look into this further and follow up tomorrow 06/08/24.

## 2024-06-08 NOTE — Telephone Encounter (Signed)
 Nancy Marsh for follow up. No answer.

## 2024-06-08 NOTE — Telephone Encounter (Signed)
 Spoke to [employer], recommends patient seen in device clinic at next available apt. (not urgent) to make programming adjustments in attempt to reduce frequent and some false alerts for Secure Sense.   Attempted to contact patient. No answer, left message to call back.

## 2024-06-09 NOTE — Telephone Encounter (Signed)
 Called and spoke with the patient regarding the need for a non urgent Device Clinic appt to continue to make some additional adjustments for her RV lead.   The patient voices understanding and is agreeable. She prefers a mid morning appointment.  Scheduled 1st available mid morning on Thursday 06/22/24 at 10:40 am.   Location of the appointment has been confirmed with the patient.  Again she voices understanding of the above and is agreeable. She was appreciative of the call back.

## 2024-06-14 ENCOUNTER — Ambulatory Visit: Payer: Medicare Other

## 2024-06-14 DIAGNOSIS — I447 Left bundle-branch block, unspecified: Secondary | ICD-10-CM

## 2024-06-15 LAB — CUP PACEART REMOTE DEVICE CHECK
Battery Remaining Longevity: 67 mo
Battery Remaining Percentage: 71 %
Battery Voltage: 2.96 V
Brady Statistic AP VP Percent: 1 %
Brady Statistic AP VS Percent: 1 %
Brady Statistic AS VP Percent: 96 %
Brady Statistic AS VS Percent: 2.6 %
Brady Statistic RA Percent Paced: 1 %
Date Time Interrogation Session: 20251008020012
HighPow Impedance: 63 Ohm
Implantable Lead Connection Status: 753985
Implantable Lead Connection Status: 753985
Implantable Lead Connection Status: 753985
Implantable Lead Implant Date: 20231004
Implantable Lead Implant Date: 20231004
Implantable Lead Implant Date: 20231004
Implantable Lead Location: 753858
Implantable Lead Location: 753859
Implantable Lead Location: 753860
Implantable Pulse Generator Implant Date: 20231004
Lead Channel Impedance Value: 410 Ohm
Lead Channel Impedance Value: 430 Ohm
Lead Channel Impedance Value: 450 Ohm
Lead Channel Pacing Threshold Amplitude: 0.5 V
Lead Channel Pacing Threshold Amplitude: 0.875 V
Lead Channel Pacing Threshold Amplitude: 0.875 V
Lead Channel Pacing Threshold Pulse Width: 0.5 ms
Lead Channel Pacing Threshold Pulse Width: 0.5 ms
Lead Channel Pacing Threshold Pulse Width: 0.5 ms
Lead Channel Sensing Intrinsic Amplitude: 0.6 mV
Lead Channel Sensing Intrinsic Amplitude: 4.2 mV
Lead Channel Setting Pacing Amplitude: 1.375
Lead Channel Setting Pacing Amplitude: 1.5 V
Lead Channel Setting Pacing Amplitude: 1.875
Lead Channel Setting Pacing Pulse Width: 0.5 ms
Lead Channel Setting Pacing Pulse Width: 0.5 ms
Lead Channel Setting Sensing Sensitivity: 1 mV
Pulse Gen Serial Number: 111063893
Zone Setting Status: 755011

## 2024-06-16 NOTE — Progress Notes (Signed)
 Remote ICD Transmission

## 2024-06-22 ENCOUNTER — Ambulatory Visit: Payer: Self-pay | Admitting: Cardiovascular Disease

## 2024-06-22 ENCOUNTER — Ambulatory Visit

## 2024-06-28 ENCOUNTER — Ambulatory Visit (HOSPITAL_COMMUNITY)
Admission: RE | Admit: 2024-06-28 | Discharge: 2024-06-28 | Disposition: A | Discharge status: Home / Self Care | Source: Ambulatory Visit | Attending: Cardiovascular Disease | Admitting: Cardiovascular Disease

## 2024-06-28 ENCOUNTER — Other Ambulatory Visit: Payer: Self-pay

## 2024-06-28 ENCOUNTER — Ambulatory Visit: Attending: Cardiology | Admitting: *Deleted

## 2024-06-28 DIAGNOSIS — I5022 Chronic systolic (congestive) heart failure: Secondary | ICD-10-CM | POA: Insufficient documentation

## 2024-06-28 LAB — CUP PACEART INCLINIC DEVICE CHECK
Battery Remaining Longevity: 62 mo
Brady Statistic RA Percent Paced: 0.04 %
Brady Statistic RV Percent Paced: 95 %
Date Time Interrogation Session: 20251022123334
HighPow Impedance: 69.75 Ohm
Implantable Lead Connection Status: 753985
Implantable Lead Connection Status: 753985
Implantable Lead Connection Status: 753985
Implantable Lead Implant Date: 20231004
Implantable Lead Implant Date: 20231004
Implantable Lead Implant Date: 20231004
Implantable Lead Location: 753858
Implantable Lead Location: 753859
Implantable Lead Location: 753860
Implantable Pulse Generator Implant Date: 20231004
Lead Channel Impedance Value: 400 Ohm
Lead Channel Impedance Value: 425 Ohm
Lead Channel Impedance Value: 437.5 Ohm
Lead Channel Pacing Threshold Amplitude: 0.5 V
Lead Channel Pacing Threshold Amplitude: 0.875 V
Lead Channel Pacing Threshold Amplitude: 0.875 V
Lead Channel Pacing Threshold Pulse Width: 0.5 ms
Lead Channel Pacing Threshold Pulse Width: 0.5 ms
Lead Channel Pacing Threshold Pulse Width: 0.5 ms
Lead Channel Sensing Intrinsic Amplitude: 4.6 mV
Lead Channel Sensing Intrinsic Amplitude: 8.7 mV
Lead Channel Setting Pacing Amplitude: 1.375
Lead Channel Setting Pacing Amplitude: 1.5 V
Lead Channel Setting Pacing Amplitude: 1.875
Lead Channel Setting Pacing Pulse Width: 0.5 ms
Lead Channel Setting Pacing Pulse Width: 0.5 ms
Lead Channel Setting Sensing Sensitivity: 0.5 mV
Pulse Gen Serial Number: 111063893
Zone Setting Status: 755011

## 2024-06-28 NOTE — Progress Notes (Signed)
 In-clinic CRT-D (multi-lead) check. Presenting Rhythm: AS/BiVP. Routine testing was performed. Thresholds, sensing, impedance trend were stable. HF diagnostics are stable. A plethora of RV oversensing alerts were noted in the episode list and were previously addressed and were the reason for today's check  (see 10/1 EPIC phone note). See below for additional notes. No treated arrhythmias. Patient BiV pacing 95% of the time. Estimated longevity 5 years 2 months. Pt enrolled in remote follow-up. Device check and program changes made with [employer] rep present Low frequency attenuation turned off  Hand isometrics negative Secure sense left on at this time R waves between 8-10 consistently even with repositioning on R&L sides and supine with no evidence of lead noise Possible myopotential oversensing from respiration per [employer] rep BS Mealor, MD notified and aware 2V CXR ordered stat today to confirm lead placement. DW,RN

## 2024-06-28 NOTE — Patient Instructions (Signed)
 Follow up as scheduled.

## 2024-07-13 ENCOUNTER — Ambulatory Visit: Payer: Self-pay | Admitting: Cardiovascular Disease

## 2024-09-13 ENCOUNTER — Ambulatory Visit: Payer: Medicare Other

## 2024-09-13 DIAGNOSIS — I447 Left bundle-branch block, unspecified: Secondary | ICD-10-CM | POA: Diagnosis not present

## 2024-09-14 LAB — CUP PACEART REMOTE DEVICE CHECK
Battery Remaining Longevity: 63 mo
Battery Remaining Percentage: 68 %
Battery Voltage: 2.98 V
Brady Statistic AP VP Percent: 1 %
Brady Statistic AP VS Percent: 1 %
Brady Statistic AS VP Percent: 98 %
Brady Statistic AS VS Percent: 2.3 %
Brady Statistic RA Percent Paced: 1 %
Date Time Interrogation Session: 20260108114714
HighPow Impedance: 71 Ohm
Implantable Lead Connection Status: 753985
Implantable Lead Connection Status: 753985
Implantable Lead Connection Status: 753985
Implantable Lead Implant Date: 20231004
Implantable Lead Implant Date: 20231004
Implantable Lead Implant Date: 20231004
Implantable Lead Location: 753858
Implantable Lead Location: 753859
Implantable Lead Location: 753860
Implantable Pulse Generator Implant Date: 20231004
Lead Channel Impedance Value: 410 Ohm
Lead Channel Impedance Value: 440 Ohm
Lead Channel Impedance Value: 440 Ohm
Lead Channel Pacing Threshold Amplitude: 0.5 V
Lead Channel Pacing Threshold Amplitude: 0.875 V
Lead Channel Pacing Threshold Amplitude: 1 V
Lead Channel Pacing Threshold Pulse Width: 0.5 ms
Lead Channel Pacing Threshold Pulse Width: 0.5 ms
Lead Channel Pacing Threshold Pulse Width: 0.5 ms
Lead Channel Sensing Intrinsic Amplitude: 4.6 mV
Lead Channel Sensing Intrinsic Amplitude: 9.5 mV
Lead Channel Setting Pacing Amplitude: 1.5 V
Lead Channel Setting Pacing Amplitude: 1.5 V
Lead Channel Setting Pacing Amplitude: 1.875
Lead Channel Setting Pacing Pulse Width: 0.5 ms
Lead Channel Setting Pacing Pulse Width: 0.5 ms
Lead Channel Setting Sensing Sensitivity: 0.5 mV
Pulse Gen Serial Number: 111063893
Zone Setting Status: 755011

## 2024-09-16 ENCOUNTER — Ambulatory Visit: Payer: Self-pay | Admitting: Cardiovascular Disease

## 2024-09-18 NOTE — Progress Notes (Signed)
 Remote ICD Transmission
